# Patient Record
Sex: Female | Born: 1940 | Race: White | Hispanic: No | Marital: Married | Smoking: Never smoker
Health system: Southern US, Community
[De-identification: ages and names within clinical notes are randomized; demographics above are authoritative.]

## PROBLEM LIST (undated history)

## (undated) HISTORY — PX: NECK SURGERY: SHX720

---

## 2001-04-19 ENCOUNTER — Other Ambulatory Visit: Admission: RE | Admit: 2001-04-19 | Discharge: 2001-04-19 | Payer: Self-pay | Admitting: Obstetrics and Gynecology

## 2001-04-29 ENCOUNTER — Encounter (INDEPENDENT_AMBULATORY_CARE_PROVIDER_SITE_OTHER): Payer: Self-pay | Admitting: Specialist

## 2001-04-29 ENCOUNTER — Encounter: Payer: Self-pay | Admitting: Obstetrics and Gynecology

## 2001-04-29 ENCOUNTER — Ambulatory Visit (HOSPITAL_COMMUNITY): Admission: RE | Admit: 2001-04-29 | Discharge: 2001-04-29 | Payer: Self-pay | Admitting: Obstetrics and Gynecology

## 2004-05-16 ENCOUNTER — Encounter (INDEPENDENT_AMBULATORY_CARE_PROVIDER_SITE_OTHER): Payer: Self-pay | Admitting: *Deleted

## 2004-05-16 ENCOUNTER — Ambulatory Visit (HOSPITAL_COMMUNITY): Admission: RE | Admit: 2004-05-16 | Discharge: 2004-05-16 | Payer: Self-pay | Admitting: Obstetrics and Gynecology

## 2004-06-05 ENCOUNTER — Ambulatory Visit (HOSPITAL_COMMUNITY): Admission: RE | Admit: 2004-06-05 | Discharge: 2004-06-05 | Payer: Self-pay | Admitting: *Deleted

## 2004-08-21 ENCOUNTER — Encounter (INDEPENDENT_AMBULATORY_CARE_PROVIDER_SITE_OTHER): Payer: Self-pay | Admitting: *Deleted

## 2004-08-21 ENCOUNTER — Ambulatory Visit (HOSPITAL_COMMUNITY): Admission: RE | Admit: 2004-08-21 | Discharge: 2004-08-21 | Payer: Self-pay | Admitting: *Deleted

## 2004-12-12 ENCOUNTER — Ambulatory Visit (HOSPITAL_COMMUNITY): Admission: RE | Admit: 2004-12-12 | Discharge: 2004-12-12 | Payer: Self-pay | Admitting: *Deleted

## 2005-04-03 ENCOUNTER — Other Ambulatory Visit: Admission: RE | Admit: 2005-04-03 | Discharge: 2005-04-03 | Payer: Self-pay | Admitting: Obstetrics and Gynecology

## 2006-12-14 ENCOUNTER — Encounter (INDEPENDENT_AMBULATORY_CARE_PROVIDER_SITE_OTHER): Payer: Self-pay | Admitting: *Deleted

## 2006-12-14 ENCOUNTER — Ambulatory Visit (HOSPITAL_COMMUNITY): Admission: RE | Admit: 2006-12-14 | Discharge: 2006-12-14 | Payer: Self-pay | Admitting: *Deleted

## 2007-07-14 ENCOUNTER — Encounter (INDEPENDENT_AMBULATORY_CARE_PROVIDER_SITE_OTHER): Payer: Self-pay | Admitting: Obstetrics and Gynecology

## 2007-07-14 ENCOUNTER — Ambulatory Visit (HOSPITAL_COMMUNITY): Admission: RE | Admit: 2007-07-14 | Discharge: 2007-07-14 | Payer: Self-pay | Admitting: Obstetrics and Gynecology

## 2009-03-07 ENCOUNTER — Ambulatory Visit (HOSPITAL_COMMUNITY): Admission: RE | Admit: 2009-03-07 | Discharge: 2009-03-07 | Payer: Self-pay | Admitting: Obstetrics and Gynecology

## 2010-03-28 ENCOUNTER — Other Ambulatory Visit: Payer: Self-pay

## 2010-03-28 DIAGNOSIS — Z1231 Encounter for screening mammogram for malignant neoplasm of breast: Secondary | ICD-10-CM

## 2010-04-11 ENCOUNTER — Observation Stay (HOSPITAL_COMMUNITY)
Admission: EM | Admit: 2010-04-11 | Discharge: 2010-04-12 | Disposition: A | Discharge status: Home / Self Care | Payer: Medicare Other | Attending: Internal Medicine | Admitting: Internal Medicine

## 2010-04-11 ENCOUNTER — Emergency Department (HOSPITAL_COMMUNITY): Payer: Medicare Other

## 2010-04-11 ENCOUNTER — Ambulatory Visit (HOSPITAL_COMMUNITY)
Admission: RE | Admit: 2010-04-11 | Discharge: 2010-04-11 | Disposition: A | Discharge status: Home / Self Care | Payer: Medicare Other | Source: Ambulatory Visit | Attending: Internal Medicine | Admitting: Internal Medicine

## 2010-04-11 DIAGNOSIS — R51 Headache: Secondary | ICD-10-CM | POA: Insufficient documentation

## 2010-04-11 DIAGNOSIS — R4789 Other speech disturbances: Secondary | ICD-10-CM | POA: Insufficient documentation

## 2010-04-11 DIAGNOSIS — R4182 Altered mental status, unspecified: Principal | ICD-10-CM | POA: Insufficient documentation

## 2010-04-11 DIAGNOSIS — E876 Hypokalemia: Secondary | ICD-10-CM | POA: Insufficient documentation

## 2010-04-11 DIAGNOSIS — E785 Hyperlipidemia, unspecified: Secondary | ICD-10-CM | POA: Insufficient documentation

## 2010-04-11 DIAGNOSIS — R55 Syncope and collapse: Secondary | ICD-10-CM | POA: Insufficient documentation

## 2010-04-11 DIAGNOSIS — R5383 Other fatigue: Secondary | ICD-10-CM | POA: Insufficient documentation

## 2010-04-11 DIAGNOSIS — I10 Essential (primary) hypertension: Secondary | ICD-10-CM | POA: Insufficient documentation

## 2010-04-11 DIAGNOSIS — R5381 Other malaise: Secondary | ICD-10-CM | POA: Insufficient documentation

## 2010-04-11 DIAGNOSIS — Z7982 Long term (current) use of aspirin: Secondary | ICD-10-CM | POA: Insufficient documentation

## 2010-04-11 DIAGNOSIS — J329 Chronic sinusitis, unspecified: Secondary | ICD-10-CM | POA: Insufficient documentation

## 2010-04-11 DIAGNOSIS — Z800 Family history of malignant neoplasm of digestive organs: Secondary | ICD-10-CM | POA: Insufficient documentation

## 2010-04-11 DIAGNOSIS — Z1231 Encounter for screening mammogram for malignant neoplasm of breast: Secondary | ICD-10-CM | POA: Insufficient documentation

## 2010-04-11 DIAGNOSIS — R42 Dizziness and giddiness: Secondary | ICD-10-CM | POA: Insufficient documentation

## 2010-04-11 DIAGNOSIS — Z79899 Other long term (current) drug therapy: Secondary | ICD-10-CM | POA: Insufficient documentation

## 2010-04-11 LAB — URINALYSIS, ROUTINE W REFLEX MICROSCOPIC
Bilirubin Urine: NEGATIVE
Hgb urine dipstick: NEGATIVE
Specific Gravity, Urine: 1.018 (ref 1.005–1.030)
Urobilinogen, UA: 0.2 mg/dL (ref 0.0–1.0)
pH: 6.5 (ref 5.0–8.0)

## 2010-04-11 LAB — DIFFERENTIAL
Basophils Absolute: 0.1 10*3/uL (ref 0.0–0.1)
Basophils Relative: 1 % (ref 0–1)
Lymphocytes Relative: 23 % (ref 12–46)
Monocytes Absolute: 0.4 10*3/uL (ref 0.1–1.0)
Monocytes Relative: 4 % (ref 3–12)
Neutro Abs: 6.4 10*3/uL (ref 1.7–7.7)
Neutrophils Relative %: 72 % (ref 43–77)

## 2010-04-11 LAB — CBC
HCT: 36.3 % (ref 36.0–46.0)
Hemoglobin: 11.9 g/dL — ABNORMAL LOW (ref 12.0–15.0)
RBC: 4.44 MIL/uL (ref 3.87–5.11)

## 2010-04-11 LAB — COMPREHENSIVE METABOLIC PANEL
ALT: 13 U/L (ref 0–35)
AST: 16 U/L (ref 0–37)
Albumin: 3.7 g/dL (ref 3.5–5.2)
Calcium: 9.3 mg/dL (ref 8.4–10.5)
Creatinine, Ser: 0.85 mg/dL (ref 0.4–1.2)
GFR calc Af Amer: >60 mL/min (ref >60)
Sodium: 140 mEq/L (ref 135–145)
Total Protein: 6.4 g/dL (ref 6.0–8.3)

## 2010-04-11 LAB — POCT CARDIAC MARKERS
Myoglobin, poc: 61.5 ng/mL (ref 12–200)
Troponin i, poc: <0.05 ng/mL (ref 0.00–0.09)

## 2010-04-12 ENCOUNTER — Observation Stay (HOSPITAL_COMMUNITY): Payer: Medicare Other

## 2010-04-12 DIAGNOSIS — G459 Transient cerebral ischemic attack, unspecified: Secondary | ICD-10-CM

## 2010-04-12 LAB — GLUCOSE, CAPILLARY

## 2010-04-12 LAB — APTT
aPTT: 27 seconds (ref 24–37)
aPTT: 29 seconds (ref 24–37)

## 2010-04-12 LAB — CBC
HCT: 34.3 % — ABNORMAL LOW (ref 36.0–46.0)
Hemoglobin: 11.2 g/dL — ABNORMAL LOW (ref 12.0–15.0)
MCV: 81.1 fL (ref 78.0–100.0)
RBC: 4.23 MIL/uL (ref 3.87–5.11)
RDW: 13 % (ref 11.5–15.5)
WBC: 7.3 10*3/uL (ref 4.0–10.5)

## 2010-04-12 LAB — LIPID PANEL
Cholesterol: 190 mg/dL (ref 0–200)
LDL Cholesterol: 127 mg/dL — ABNORMAL HIGH (ref 0–99)
Total CHOL/HDL Ratio: 3.5 RATIO
Triglycerides: 46 mg/dL (ref <150)

## 2010-04-12 LAB — COMPREHENSIVE METABOLIC PANEL
Alkaline Phosphatase: 28 U/L — ABNORMAL LOW (ref 39–117)
BUN: 12 mg/dL (ref 6–23)
Creatinine, Ser: 0.79 mg/dL (ref 0.4–1.2)
Glucose, Bld: 103 mg/dL — ABNORMAL HIGH (ref 70–99)
Potassium: 3.8 mEq/L (ref 3.5–5.1)
Total Protein: 5.8 g/dL — ABNORMAL LOW (ref 6.0–8.3)

## 2010-04-12 LAB — CARDIAC PANEL(CRET KIN+CKTOT+MB+TROPI)
Relative Index: INVALID (ref 0.0–2.5)
Total CK: 78 U/L (ref 7–177)
Troponin I: 0.01 ng/mL (ref 0.00–0.06)

## 2010-04-12 LAB — PROTIME-INR: INR: 1.06 (ref 0.00–1.49)

## 2010-04-12 LAB — HEMOGLOBIN A1C: Hgb A1c MFr Bld: 6 % — ABNORMAL HIGH (ref <5.7)

## 2010-04-13 NOTE — Discharge Summary (Signed)
Leslie Orozco, Leslie Orozco             ACCOUNT NO.:  [id number]  MEDICAL RECORD NO.:  [id number]           PATIENT TYPE:  I  LOCATION:  1513                         FACILITY:  [facility]  PHYSICIAN:  Talmage Nap, MD  DATE OF BIRTH:  09-May-1940  DATE OF ADMISSION:  04/11/2010 DATE OF DISCHARGE:  04/12/2010                        DISCHARGE SUMMARY - REFERRING   DISCHARGING DOCTOR:  Talmage Nap, M.D.  PRIMARY CARE PHYSICIAN:  Soyla Murphy. Renne Crigler, M.D.  DISCHARGE DIAGNOSES: 1. Questionable transient ischemic attack. 2. Dyslipidemia. 3. Hypertension.  HOSPITAL COURSE:  Patient is a 70 year old African-American female with history of hypertension, who was admitted to the hospital on April 11, 2010 by Dr. Conley Canal with complaints of altered mental status and weakness.  At about 7:00 p.m. to 8:00 p.m. on April 11, 2010, patient was said to be having extreme difficulty in getting off the bed and her speech was said to have been slurred at that time as well.  This was, however, said to be very transient.  There was no history of frank loss of consciousness.  There was no history of chest pain.  There was no history of weakness.  There was no history of fever.  No chills.  No rigor.  No shortness of breath.  At the time patient was seen in the emergency room, she was said to be lucid and she denied any complaint. She was, however, admitted for further evaluation to rule out CVA.  MEDICATIONS:  Her preadmission meds as per the initial H and P were aspirin and blood pressure medication, name is unknown.  ALLERGIES:  She has no known allergies.  SOCIAL HISTORY:  [place] Spaniel to be negative for alcohol, tobacco use.  Lives at home with her spouse.  FAMILY HISTORY:  York Spaniel to be negative for coronary artery disease, but there is, however, a family history of colon cancer.  REVIEW OF SYSTEMS:  Essentially documented in the initial history and physical at the time patient was seen by the  admitting physician.  PHYSICAL EXAMINATION:  VITAL SIGNS:  Blood pressure is 114/82, heart rate 87, temperature is 98.4, respiratory rate 18, saturating 98% in room air. HEENT:  Pupils are reactive to light and extraocular muscles are intact. NECK:  No jugular venous distention.  No carotid bruit.  No lymphadenopathy. CHEST:  Clear to auscultation. HEART:  Heart sounds are one and two. ABDOMEN:  Said to be soft, nontender.  Liver, spleen, kidney not palpable.  Bowel sounds are positive. EXTREMITIES:  No pedal edema. NEUROLOGIC EXAMINATION:  Did not show any lateralizing signs. NEUROPSYCHIATRIC EVALUATION:  Unremarkable.  LABORATORY DATA:  Urinalysis unremarkable.  Complete blood count with differential showed WBC of 9.0, hemoglobin 11.9, hematocrit 36.3, MCV 81.8 with the platelet count of 322, normal differentials.  Cardiac markers:  Troponin-I less than 0.01.  Comprehensive metabolic panel showed sodium of 140, potassium of 3.4, chloride of 108 with the bicarb of 27, glucose is 114, BUN is 13, creatinine 0.85.  Coagulation profile showed PT 30.3, INR of 0.9 and APTT of 29.  Hemoglobin A1c is 6.0. Lipid panel showed total cholesterol of 190, triglyceride 46, HDL cholesterol 54, LDL  cholesterol is 127.  A repeat comprehensive metabolic panel done on April 12, 2010 showed sodium of 140, potassium of 3.8, chloride of 109 with the bicarb of 24, glucose is 103, BUN is 12, creatinine 0.79.  LFTs normal.  DIAGNOSTIC STUDIES:  Imaging studies done include CT of the head without contrast, normal.  MRI of the head without contrast showed mild chronic sinusitis, otherwise unremarkable.  MRA of the brain negative.  Carotid duplex did not show any stenosis of the RCA and 2-D echo showed normal left ventricular cavity with an EF of 65%-60%, no regional wall motion abnormalities seen and PA pressure of 35 mmHg.  HOSPITAL COURSE:  Patient was admitted to telemetry.  She was given aspirin 325 mg  p.o. daily and she was also given amlodipine 5 mg p.o. daily.  Since the patient was slightly hypokalemic, she was given potassium chloride 40 mEq p.o. stat.  Other medications given to the patient include pantoprazole 40 mg p.o. q.12h. for GI prophylaxis, Zocor 20 mg p.o. daily and Lovenox 40 mg subcu q.24h. for DVT prophylaxis. Patient was, however, seen by me for the very first time in this admission today and denied any chest pain, no slurred speech.  No weakness on any side of the body.  Examination of the patient was essentially unremarkable.  Her vital signs:  Blood pressure is 110/60, temperature is 98.0, pulse 61, respiratory rate 20, medically stable. Plan is for patient to be discharged home today on activity as tolerated.  Low-sodium, low-cholesterol diet.  She is to follow with her primary care physician in 1-2 weeks.  DISCHARGE MEDICATIONS:  Medications to be taken at home will include: 1. Aspirin 325 mg p.o. daily. 2. Simvastatin 20 mg one p.o. daily. 3. Benazepril/hydrochlorothiazide 10/12.5 one p.o. daily. 4. Calcium carbonate/vitamin 600/400 one p.o. daily. 5. Estradiol 1 mg p.o. daily. 6. Fish oil 1000 mg one p.o. b.i.d. 7. Medroxyprogesterone acetate 2.5 mg 2 tablets p.o. daily. 8. Vitamin D 2000 units 1 tablet p.o. daily.     Talmage Nap, MD     CN/MEDQ  D:  04/12/2010  T:  04/12/2010  Job:  [id number]  cc:   Soyla Murphy. Renne Crigler, M.D. Fax: [phone]  Electronically Signed by Talmage Nap  on 04/13/2010 06:47:04 AM

## 2010-04-13 NOTE — H&P (Signed)
Leslie Orozco, Leslie Orozco             ACCOUNT NO.:  [id number]  MEDICAL RECORD NO.:  [id number]           PATIENT TYPE:  LOCATION:                                 FACILITY:  PHYSICIAN:  Conley Canal, MD      DATE OF BIRTH:  Mar 25, 1940  DATE OF ADMISSION: DATE OF DISCHARGE:                             HISTORY & PHYSICAL   PRIMARY CARE PHYSICIAN:  Soyla Murphy. Renne Crigler, MD  CHIEF COMPLAINT:  Altered mental status, weakness.  HISTORY OF PRESENT ILLNESS:  Leslie Orozco is a pleasant 70 year old female with history of hypertension, who comes in with complaints of altered mental status that started around 8 p.m.  She states that she had been feeling dizzy and weak around 7 p.m.  She took a shower and around 8 p.m., her husband found her partially in bed.  The patient states that she could not get into bed as she was feeling weak.  She apparently had slurred speech at that time.  No reported history of seizure. Otherwise, the patient denies headaches.  No fever, no palpitations, but she mentions that she was told from age 45 that she had an irregular heartbeat and her doctor discouraged her from taking caffeine, but she has not been taking any medications other than aspirin for the irregular heartbeat.  Otherwise at the time of my evaluation, the patient is quite lucid and denies any complaints.  PAST MEDICAL HISTORY:  Hypertension.  ALLERGIES:  No known drug allergies.  HOME MEDICATIONS:  The patient cannot confirm other than aspirin and some blood pressure medications.  FAMILY HISTORY:  Negative for premature coronary artery disease, but there is a history of colon cancer in her brother, her sister died at age 49 of some type of cancer.  REVIEW OF SYSTEMS:  Unremarkable except as highlighted in the history of present illness.  SOCIAL HISTORY:  The patient is married, lives with her husband.  Denies cigarette smoking, alcohol, or illicit drugs.  PHYSICAL EXAMINATION:  GENERAL:  This  is a pleasant lady who is not in acute distress. VITAL SIGNS:  Blood pressure 114/82, heart rate is 87, temperature 98.4, respirations 18, and oxygen saturation is 98% on room air. HEAD, EARS, EYES, NOSE, AND THROAT:  Pupils equal, reacting to light. NECK:  No jugular venous distention.  No carotid bruits. RESPIRATORY:  Good air entry bilaterally with no rhonchi, rales, or wheezes. CARDIOVASCULAR:  First and second heart sounds heard.  No murmurs. Pulse regular. ABDOMEN:  Soft and nontender.  No palpable organomegaly.  Bowel sounds are normal. CNS: The patient is alert and oriented to person, place, and time.  She has normal speech.  No localizing neurological deficits. EXTREMITIES:  No pedal edema.  Peripheral pulses equal.  LABORATORY DATA:  Reviewed, significant for WBC 9, hemoglobin 11.9, hematocrit 36. 3, and platelet count 322.  Urinalysis normal.  Point-of- care cardiac enzymes negative x1.  Electrolytes show sodium 140, potassium 3.4, BUN 13, creatinine 0.85, and glucose 114.  Transaminases normal.  Coagulation profile normal.  CT of the brain without contrast was normal.  IMPRESSION:  A 70 year old female with history of hypertension which  seems to be reasonably controlled and a questionable history of irregular heartbeat from childhood, who is presenting with transient neurological symptoms of altered mental status and weakness.  The differential diagnosis is transient ischemic attack versus a small cerebrovascular accident with no residual deficit at present.  PLAN: 1. TIA versus CVA.  We will admit the patient to telemetry.  We will     perform TIA workup including 2-D echocardiogram, EKG, carotid     Dopplers, and MRI of the brain.  Monitor the patient on telemetry.     Obtain hemoglobin A1c, fasting lipids panel.  Meanwhile, the     patient will be on full-dose aspirin.  We will continue blood     pressure control.  Plan to resume home medications once confirmed. 2.  DVT and GI prophylaxis.  The patient's condition is stable.     Conley Canal, MD     SR/MEDQ  D:  04/12/2010  T:  04/12/2010  Job:  [id number]  cc:   Soyla Murphy. Renne Crigler, M.D.  Electronically Signed by Conley Canal  on 04/13/2010 05:08:18 AM

## 2010-06-18 NOTE — Op Note (Signed)
Leslie Orozco, Leslie Orozco             ACCOUNT NO.:  [id number]   MEDICAL RECORD NO.:  [id number]          PATIENT TYPE:  AMB   LOCATION:  ENDO                         FACILITY:  [facility]   PHYSICIAN:  Georgiana Spinner, M.D.    DATE OF BIRTH:  1940-03-11   DATE OF PROCEDURE:  DATE OF DISCHARGE:                               OPERATIVE REPORT   PROCEDURE:  Colonoscopy.   INDICATIONS:  Colon polyps.   ANESTHESIA:  Fentanyl 70 mcg, Versed 6 mg.   PROCEDURE:  With the patient mildly sedated in the left lateral  decubitus position, the Pentax videoscopic colonoscope was inserted in  the rectum and passed under direct vision subsequently to reach the  cecum, identified by the ileocecal valve and the appendiceal orifice,  both of which were photographed.  From this point colonoscope was slowly  withdrawn, taking circumferential views of the colonic mucosa, stopping  in the ascending colon near the hepatic flexure where a small polyp was  seen, photographed, and removed using hot biopsy forceps technique,  setting of 20/150 blended current.  We next stopped in the rectum which  appeared normal other than a scar from a previous polypectomy that had  been done 2 years ago and on retroflexed view showed hemorrhoids.  The  endoscope was straightened and withdrawn.  The patient's vital signs and  pulse oximeter remained stable.  The patient tolerated the procedure  well without apparent complications.   FINDINGS:  Internal hemorrhoids and polyp of the hepatic flexure.  Diverticulosis with some twisting of the colon is noted.   PLAN:  Await biopsy report.  The patient will call me for results and  follow up with me as an outpatient as needed.           ______________________________  Georgiana Spinner, M.D.     GMO/MEDQ  D:  12/14/2006  T:  12/14/2006  Job:  [id number]

## 2010-06-21 NOTE — Op Note (Signed)
[facility]  Patient:    Leslie Orozco, Leslie Orozco Visit Number: [id number] MRN: [id number]          Service Type: DSU Location: DAY Attending Physician:  Rosalee Kaufman Dictated by:   Harl Bowie, M.D. Proc. Date: 04/29/01 Admit Date:  04/29/2001                             Operative Report  PREOPERATIVE DIAGNOSIS:  Postmenopausal bleeding on hormone replacement therapy.  POSTOPERATIVE DIAGNOSIS:  Postmenopausal bleeding on hormone replacement therapy, with possible polyp from the endometrium.  OPERATION:  D&C.  SURGEON:  Harl Bowie, M.D.  ANESTHESIA:  MAC with local.  FINDINGS AND PROCEDURE:  The patient prepped and draped in the usual fashion for a vaginal procedure.  The patient examined and found to have a uterus irregular and enlarged.  Following, the cervix was grasped with a single-tooth tenaculum, and 1% Xylocaine was infiltrated around the cervix.  The cervix was then sounded to three inches.  The cervix was dilated and the cavity entered with a sharp curette.  It was noted that the cavity was somewhat irregular.  A small to moderate amount of tissue was obtained with a fragment that had the appearance of a polyp.  No additional tissue was obtained with the round forceps and a serrated curette.  Blood loss during the procedure was minimal. The patient tolerated the procedure well and sent to the recovery room in good condition. Dictated by:   Harl Bowie, M.D. Attending Physician:  Rosalee Kaufman DD:  04/29/01 TD:  04/29/01 Job: [id number]

## 2010-06-21 NOTE — Op Note (Signed)
NAMEROANNE, HAYE             ACCOUNT NO.:  [id number]   MEDICAL RECORD NO.:  [id number]          PATIENT TYPE:  AMB   LOCATION:  ENDO                         [facility]ACILITY:  [facility]   PHYSICIAN:  Georgiana Spinner, M.D.    DATE OF BIRTH:  1940-12-22   DATE OF PROCEDURE:  06/05/2004  DATE OF DISCHARGE:                                 OPERATIVE REPORT   PROCEDURE:  Flexible sigmoidoscopy.   INDICATIONS:  Colon polyps.   ANESTHESIA:  Demerol 60, Versed 6 mg.   DESCRIPTION OF PROCEDURE:  With the patient mildly sedated in the left  lateral decubitus position, the Olympus videoscopic colonoscope was inserted  in the rectum after rectal examination revealed a stricture. Subsequently we  had passed this under direct vision to the sigmoid colon but could advance  it no further at which point the colon made a sharp turn to tight for me to  be able to pass the colonoscope despite repeated efforts.  I decided  therefore to withdraw the scope taking circumferential views of the  remaining colonic mucosa stopping in the rectum which showed a polyp  photographed on direct view and appeared unremarkable on retroflexed view.  The endoscope was straightened and withdrawn. The patient's vital signs and  pulse oximeter remained stable. The patient tolerated procedure well without  complications.   FINDINGS:  Polyp of rectum, stricture of sigmoid colon area, stricture of  anal canal.   PLAN:  Air-contrast barium enema to review the remainder of the colon and  will have the patient follow-up with me as subsequently she will need at  least a sigmoidoscopy to remove the polyp seen in the rectum.      GMO/MEDQ  D:  06/05/2004  T:  06/05/2004  Job:  [id number]

## 2010-06-21 NOTE — Op Note (Signed)
NAMEMERICA, PRELL             ACCOUNT NO.:  [id number]   MEDICAL RECORD NO.:  [id number]          PATIENT TYPE:  AMB   LOCATION:  DAY                          FACILITY:  [facility]   PHYSICIAN:  Leona Singleton, M.D.     DATE OF BIRTH:  28-Aug-1940   DATE OF PROCEDURE:  05/16/2004  DATE OF DISCHARGE:                                 OPERATIVE REPORT   PREOPERATIVE DIAGNOSES:  Postmenopausal bleeding on hormone replacement  therapy.   POSTOPERATIVE DIAGNOSES:  Postmenopausal bleeding on hormone replacement  therapy.   OPERATION:  D&C.   SURGEON:  Leona Singleton, M.D.   ANESTHESIA:  MAC with local.   FINDINGS AND PROCEDURE:  The patient was prepped and draped in the usual  fashion for a vaginal procedure. The patient examined and found to have the  uterus normal size, the adnexa clear.  Following a weighted speculum was  placed in the vagina and the cervix grasped with a single tooth tenaculum.  The cervix was sounded to 2 3/4 inches, cervix dilated and the cavity  entered with a sharp curette. A small amount of tissue was obtained, a small  amount of additional tissue was obtained with the Randall stone forceps and  the serrated curette. Blood loss during the procedure was minimal. The  patient tolerated the procedure well and was transferred to the recovery  room in good condition.      KB/MEDQ  D:  05/16/2004  T:  05/16/2004  Job:  [id number]

## 2010-06-21 NOTE — Op Note (Signed)
Leslie Orozco, Leslie Orozco             ACCOUNT NO.:  [id number]   MEDICAL RECORD NO.:  [id number]          PATIENT TYPE:  AMB   LOCATION:  SDC                           FACILITY:  [facility]   PHYSICIAN:  Carrington Clamp, M.D. DATE OF BIRTH:  02-01-1941   DATE OF PROCEDURE:  04/13/2007  DATE OF DISCHARGE:                               OPERATIVE REPORT   PREOPERATIVE DIAGNOSIS:  Postmenopausal bleeding.   POSTOPERATIVE DIAGNOSES:  1. Postmenopausal bleeding.  2. Polyps.   PROCEDURE:  Dilation and curettage with hysteroscopy.   SURGEON:  Carrington Clamp, MD   ASSISTANT:  None.   ANESTHESIA:  General LMA.   FINDINGS:  A number of intrauterine polyps.  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  This was  diagnosed as a septum because it was solid through and through in the  midline, it was then appeared to be a fibroid that was attached on one  side, but not on the other.  The attachment was both anterior and  posterior.  Hysteroscopy deficit was 35 mL.   SPECIMENS:  Uterine curettings.   DISPOSITION:  To Pathology.   ESTIMATED BLOOD LOSS:  Minimal.   IV FLUIDS:  500 mL.   URINE OUTPUT:  Not measured.   COMPLICATIONS:  None.   COUNTS:  Correct x3.   TECHNIQUE:  After adequate LMA anesthesia was achieved, the patient was  prepped and draped in the usual sterile fashion in dorsal lithotomy  position.  The bladder was emptied with a red rubber catheter and a  speculum placed in the vagina.  The cervix was grasped with a single-  tooth tenaculum and the cervix dilated up with Shawnie Pons dilators.  The  hysteroscope was passed into the uterine cavity and the above findings  noted.  Alternating grasping  with the polyp forceps and sharp curettage was performed to remove  tissue from the uterine cavity.  Once the polyps were apparently all  removed via a check by the hysteroscope, all instruments were then  withdrawn from the vagina, and  the patient tolerated the procedure well  and was returned to recovery room in stable condition.      Carrington Clamp, M.D.  Electronically Signed     MH/MEDQ  D:  07/14/2007  T:  07/15/2007  Job:  [id number]

## 2010-06-21 NOTE — Op Note (Signed)
NAMEBRITLYN, MARTINE             ACCOUNT NO.:  [id number]   MEDICAL RECORD NO.:  [id number]          PATIENT TYPE:  AMB   LOCATION:  ENDO                         FACILITY:  [facility]   PHYSICIAN:  Georgiana Spinner, M.D.    DATE OF BIRTH:  [birth year]   DATE OF PROCEDURE:  DATE OF DISCHARGE:                                 OPERATIVE REPORT   PROCEDURE PERFORMED:  Flexible sigmoidoscopy.   INDICATIONS:  ___________   ANESTHESIA:  None given.   PROCEDURE:  With the patient ____________ rectal exam ___________ into the  rectum ___________ Olympus videoscopic colonoscope  and passed it under  direct vision to sigmoid colon identified by diverticulosis seen which was  approximately 50 cm from the anal verge.  Failure to progress the endoscope  was then slowly withdrawn ___________ colonic mucosa stopping in the rectum  which appeared normal on direct and showed small hemorrhoids on retroflex  view.  The endoscope was straightened and withdrawn.  __________ vital signs  and pulse oximeter remained stable.  The patient tolerated the procedure  well __________ apparent complications.   FINDINGS:  __________ hemorrhoids, no residual polyp.   PLAN:  Have patient follow-up ____________.           ______________________________  Georgiana Spinner, M.D.     GMO/MEDQ  D:  12/12/2004  T:  12/12/2004  Job:  [id number]

## 2010-06-21 NOTE — Op Note (Signed)
NAMEJAILEEN, Leslie Orozco             ACCOUNT NO.:  [id number]   MEDICAL RECORD NO.:  [id number]          PATIENT TYPE:  AMB   LOCATION:  ENDO                         FACILITY:  [facility]   PHYSICIAN:  Georgiana Spinner, M.D.    DATE OF BIRTH:  Sep 06, 1940   DATE OF PROCEDURE:  08/21/2004  DATE OF DISCHARGE:                                 OPERATIVE REPORT   PROCEDURE:  Flexible sigmoidoscopy with polypectomy.   INDICATIONS:  Colon polyp.   ANESTHESIA:  None given.   DESCRIPTION OF PROCEDURE:  With the patient in the left lateral decubitus  position, the Olympus videoscopic pediatric colonoscope was inserted in the  rectum and passed under direct vision to approximately 25 cm from the anal  verge. From this point, the colonoscope was slowly withdrawn taking  circumferential views of the colonic mucosa stopping in the rectum which  showed a polyp on direct view and was unremarkable on retroflexed view. The  endoscope was straightened, snare was passed and the polyp was removed and  suctioned to the scope. The endoscope was withdrawn. The patient's vital  signs and pulse oximeter remained stable. The patient tolerated the  procedure well without apparent complications.   FINDINGS:  Polyp of rectum removed, await biopsy report. The patient will  call me for results and follow-up with me as an outpatient       GMO/MEDQ  D:  08/21/2004  T:  08/21/2004  Job:  [id number]

## 2010-10-31 LAB — CBC
MCV: 81.6
RBC: 4.56
WBC: 4.2

## 2010-10-31 LAB — URINALYSIS, ROUTINE W REFLEX MICROSCOPIC
Glucose, UA: NEGATIVE
pH: 7

## 2010-10-31 LAB — BASIC METABOLIC PANEL
Chloride: 101
GFR calc Af Amer: >60
Potassium: 3.1 — ABNORMAL LOW
Sodium: 136

## 2011-03-06 ENCOUNTER — Other Ambulatory Visit (HOSPITAL_COMMUNITY): Payer: Self-pay | Admitting: Internal Medicine

## 2011-03-06 DIAGNOSIS — Z1231 Encounter for screening mammogram for malignant neoplasm of breast: Secondary | ICD-10-CM

## 2011-04-15 ENCOUNTER — Ambulatory Visit (HOSPITAL_COMMUNITY)
Admission: RE | Admit: 2011-04-15 | Discharge: 2011-04-15 | Disposition: A | Discharge status: Home / Self Care | Payer: Medicare Other | Source: Ambulatory Visit | Attending: Internal Medicine | Admitting: Internal Medicine

## 2011-04-15 DIAGNOSIS — Z1231 Encounter for screening mammogram for malignant neoplasm of breast: Secondary | ICD-10-CM

## 2011-07-29 ENCOUNTER — Other Ambulatory Visit (HOSPITAL_COMMUNITY)
Admission: RE | Admit: 2011-07-29 | Discharge: 2011-07-29 | Disposition: A | Discharge status: Home / Self Care | Payer: Medicare Other | Source: Ambulatory Visit | Attending: Internal Medicine | Admitting: Internal Medicine

## 2011-07-29 ENCOUNTER — Other Ambulatory Visit: Payer: Self-pay | Admitting: Registered Nurse

## 2011-07-29 DIAGNOSIS — Z01419 Encounter for gynecological examination (general) (routine) without abnormal findings: Secondary | ICD-10-CM | POA: Insufficient documentation

## 2012-04-01 ENCOUNTER — Other Ambulatory Visit (HOSPITAL_COMMUNITY): Payer: Self-pay | Admitting: Internal Medicine

## 2012-04-01 DIAGNOSIS — Z1231 Encounter for screening mammogram for malignant neoplasm of breast: Secondary | ICD-10-CM

## 2012-04-02 ENCOUNTER — Other Ambulatory Visit: Payer: Self-pay | Admitting: Gastroenterology

## 2012-04-15 ENCOUNTER — Ambulatory Visit (HOSPITAL_COMMUNITY)
Admission: RE | Admit: 2012-04-15 | Discharge: 2012-04-15 | Disposition: A | Discharge status: Home / Self Care | Payer: Medicare Other | Source: Ambulatory Visit | Attending: Internal Medicine | Admitting: Internal Medicine

## 2012-04-15 DIAGNOSIS — Z1231 Encounter for screening mammogram for malignant neoplasm of breast: Secondary | ICD-10-CM

## 2013-05-30 ENCOUNTER — Other Ambulatory Visit (HOSPITAL_COMMUNITY): Payer: Self-pay | Admitting: Internal Medicine

## 2013-05-30 DIAGNOSIS — Z1231 Encounter for screening mammogram for malignant neoplasm of breast: Secondary | ICD-10-CM

## 2013-06-07 ENCOUNTER — Ambulatory Visit (HOSPITAL_COMMUNITY)
Admission: RE | Admit: 2013-06-07 | Discharge: 2013-06-07 | Disposition: A | Discharge status: Home / Self Care | Payer: Medicare HMO | Source: Ambulatory Visit | Attending: Internal Medicine | Admitting: Internal Medicine

## 2013-06-07 DIAGNOSIS — Z1231 Encounter for screening mammogram for malignant neoplasm of breast: Secondary | ICD-10-CM | POA: Insufficient documentation

## 2014-05-26 ENCOUNTER — Other Ambulatory Visit (HOSPITAL_COMMUNITY): Payer: Self-pay | Admitting: Internal Medicine

## 2014-05-26 DIAGNOSIS — Z1231 Encounter for screening mammogram for malignant neoplasm of breast: Secondary | ICD-10-CM

## 2014-06-09 ENCOUNTER — Ambulatory Visit (HOSPITAL_COMMUNITY)
Admission: RE | Admit: 2014-06-09 | Discharge: 2014-06-09 | Disposition: A | Discharge status: Home / Self Care | Payer: Commercial Managed Care - HMO | Source: Ambulatory Visit | Attending: Internal Medicine | Admitting: Internal Medicine

## 2014-06-09 DIAGNOSIS — Z1231 Encounter for screening mammogram for malignant neoplasm of breast: Secondary | ICD-10-CM | POA: Diagnosis present

## 2015-08-21 ENCOUNTER — Other Ambulatory Visit: Payer: Self-pay | Admitting: Internal Medicine

## 2015-08-21 DIAGNOSIS — Z1231 Encounter for screening mammogram for malignant neoplasm of breast: Secondary | ICD-10-CM

## 2015-08-28 ENCOUNTER — Ambulatory Visit
Admission: RE | Admit: 2015-08-28 | Discharge: 2015-08-28 | Disposition: A | Discharge status: Home / Self Care | Payer: Commercial Managed Care - HMO | Source: Ambulatory Visit | Attending: Internal Medicine | Admitting: Internal Medicine

## 2015-08-28 DIAGNOSIS — Z1231 Encounter for screening mammogram for malignant neoplasm of breast: Secondary | ICD-10-CM

## 2016-05-26 DIAGNOSIS — Z0000 Encounter for general adult medical examination without abnormal findings: Secondary | ICD-10-CM | POA: Diagnosis not present

## 2016-05-26 DIAGNOSIS — E559 Vitamin D deficiency, unspecified: Secondary | ICD-10-CM | POA: Diagnosis not present

## 2016-05-26 DIAGNOSIS — Z0001 Encounter for general adult medical examination with abnormal findings: Secondary | ICD-10-CM | POA: Diagnosis not present

## 2016-05-26 DIAGNOSIS — I10 Essential (primary) hypertension: Secondary | ICD-10-CM | POA: Diagnosis not present

## 2016-05-26 DIAGNOSIS — Z13220 Encounter for screening for lipoid disorders: Secondary | ICD-10-CM | POA: Diagnosis not present

## 2016-05-29 DIAGNOSIS — E7800 Pure hypercholesterolemia, unspecified: Secondary | ICD-10-CM | POA: Diagnosis not present

## 2016-05-29 DIAGNOSIS — I10 Essential (primary) hypertension: Secondary | ICD-10-CM | POA: Diagnosis not present

## 2016-05-29 DIAGNOSIS — Z0000 Encounter for general adult medical examination without abnormal findings: Secondary | ICD-10-CM | POA: Diagnosis not present

## 2016-06-03 DIAGNOSIS — H40033 Anatomical narrow angle, bilateral: Secondary | ICD-10-CM | POA: Diagnosis not present

## 2016-06-03 DIAGNOSIS — H2513 Age-related nuclear cataract, bilateral: Secondary | ICD-10-CM | POA: Diagnosis not present

## 2016-06-29 ENCOUNTER — Emergency Department (HOSPITAL_COMMUNITY): Payer: Medicare Other

## 2016-06-29 ENCOUNTER — Emergency Department (HOSPITAL_COMMUNITY)
Admission: EM | Admit: 2016-06-29 | Discharge: 2016-06-30 | Disposition: A | Discharge status: Home / Self Care | Payer: Medicare Other | Attending: Emergency Medicine | Admitting: Emergency Medicine

## 2016-06-29 DIAGNOSIS — Z7982 Long term (current) use of aspirin: Secondary | ICD-10-CM | POA: Insufficient documentation

## 2016-06-29 DIAGNOSIS — M62838 Other muscle spasm: Secondary | ICD-10-CM | POA: Diagnosis not present

## 2016-06-29 DIAGNOSIS — M546 Pain in thoracic spine: Secondary | ICD-10-CM | POA: Diagnosis present

## 2016-06-29 DIAGNOSIS — E876 Hypokalemia: Secondary | ICD-10-CM | POA: Diagnosis not present

## 2016-06-29 DIAGNOSIS — Z79899 Other long term (current) drug therapy: Secondary | ICD-10-CM | POA: Insufficient documentation

## 2016-06-29 DIAGNOSIS — R1011 Right upper quadrant pain: Secondary | ICD-10-CM | POA: Diagnosis not present

## 2016-06-29 DIAGNOSIS — R252 Cramp and spasm: Secondary | ICD-10-CM

## 2016-06-29 DIAGNOSIS — R079 Chest pain, unspecified: Secondary | ICD-10-CM | POA: Diagnosis not present

## 2016-06-29 DIAGNOSIS — R109 Unspecified abdominal pain: Secondary | ICD-10-CM | POA: Diagnosis not present

## 2016-06-29 LAB — CBC WITH DIFFERENTIAL/PLATELET
BASOS ABS: 0 10*3/uL (ref 0.0–0.1)
BASOS PCT: 0 %
EOS ABS: 0 10*3/uL (ref 0.0–0.7)
Eosinophils Relative: 0 %
HCT: 35.3 % — ABNORMAL LOW (ref 36.0–46.0)
Hemoglobin: 11.8 g/dL — ABNORMAL LOW (ref 12.0–15.0)
LYMPHS PCT: 23 %
Lymphs Abs: 1.7 10*3/uL (ref 0.7–4.0)
MCH: 26.9 pg (ref 26.0–34.0)
MCHC: 33.4 g/dL (ref 30.0–36.0)
MCV: 80.4 fL (ref 78.0–100.0)
MONO ABS: 0.5 10*3/uL (ref 0.1–1.0)
Monocytes Relative: 7 %
Neutro Abs: 5.2 10*3/uL (ref 1.7–7.7)
Neutrophils Relative %: 70 %
PLATELETS: 303 10*3/uL (ref 150–400)
RBC: 4.39 MIL/uL (ref 3.87–5.11)
RDW: 12.7 % (ref 11.5–15.5)
WBC: 7.5 10*3/uL (ref 4.0–10.5)

## 2016-06-29 LAB — URINALYSIS, ROUTINE W REFLEX MICROSCOPIC
Bilirubin Urine: NEGATIVE
Glucose, UA: NEGATIVE mg/dL
HGB URINE DIPSTICK: NEGATIVE
KETONES UR: 20 mg/dL — AB
Nitrite: NEGATIVE
PROTEIN: NEGATIVE mg/dL
Specific Gravity, Urine: 1.024 (ref 1.005–1.030)
pH: 6 (ref 5.0–8.0)

## 2016-06-29 LAB — COMPREHENSIVE METABOLIC PANEL
ALBUMIN: 3.8 g/dL (ref 3.5–5.0)
ALT: 33 U/L (ref 14–54)
AST: 26 U/L (ref 15–41)
Alkaline Phosphatase: 39 U/L (ref 38–126)
Anion gap: 9 (ref 5–15)
BUN: 12 mg/dL (ref 6–20)
CHLORIDE: 101 mmol/L (ref 101–111)
CO2: 24 mmol/L (ref 22–32)
CREATININE: 0.73 mg/dL (ref 0.44–1.00)
Calcium: 8 mg/dL — ABNORMAL LOW (ref 8.9–10.3)
GFR calc Af Amer: >60 mL/min (ref >60)
GLUCOSE: 115 mg/dL — AB (ref 65–99)
POTASSIUM: 2.6 mmol/L — AB (ref 3.5–5.1)
SODIUM: 134 mmol/L — AB (ref 135–145)
Total Bilirubin: 0.6 mg/dL (ref 0.3–1.2)
Total Protein: 6.4 g/dL — ABNORMAL LOW (ref 6.5–8.1)

## 2016-06-29 LAB — I-STAT TROPONIN, ED: Troponin i, poc: 0 ng/mL (ref 0.00–0.08)

## 2016-06-29 LAB — LIPASE, BLOOD: LIPASE: 50 U/L (ref 11–51)

## 2016-06-29 LAB — MAGNESIUM: MAGNESIUM: 2.1 mg/dL (ref 1.7–2.4)

## 2016-06-29 MED ORDER — ACETAMINOPHEN 500 MG PO TABS
1000.0000 mg | ORAL_TABLET | Freq: Once | ORAL | Status: AC
  Administered 2016-06-29: 1000 mg via ORAL
  Filled 2016-06-29: qty 2

## 2016-06-29 MED ORDER — POTASSIUM CHLORIDE 10 MEQ/100ML IV SOLN
10.0000 meq | Freq: Once | INTRAVENOUS | Status: AC
  Administered 2016-06-29: 10 meq via INTRAVENOUS
  Filled 2016-06-29: qty 100

## 2016-06-29 MED ORDER — METHOCARBAMOL 500 MG PO TABS
500.0000 mg | ORAL_TABLET | Freq: Once | ORAL | Status: AC
  Administered 2016-06-29: 500 mg via ORAL
  Filled 2016-06-29: qty 1

## 2016-06-29 MED ORDER — POTASSIUM CHLORIDE CRYS ER 20 MEQ PO TBCR
40.0000 meq | EXTENDED_RELEASE_TABLET | Freq: Once | ORAL | Status: AC
  Administered 2016-06-29: 40 meq via ORAL
  Filled 2016-06-29: qty 2

## 2016-06-29 NOTE — ED Provider Notes (Signed)
[facility]-EMERGENCY DEPT Provider Note   CSN: [id number] Arrival date & time: 06/29/16  2020     History   Chief Complaint Chief Complaint  Patient presents with  . Flank Pain    HPI Leslie Orozco is a 76 y.o. female.  The history is provided by the patient.  Back Pain   This is a new problem. The current episode started 2 days ago. The problem occurs constantly. The problem has not changed since onset.The pain is associated with no known injury. The pain is present in the thoracic spine. The quality of the pain is described as aching. The pain does not radiate. The pain is moderate. Exacerbated by: nothing. Associated symptoms include abdominal pain (ruq pain). Pertinent negatives include no chest pain, no fever and no dysuria. Treatments tried: pain med by EMS - mild relief. pain does improve with movement. The treatment provided mild relief.  Abdominal Pain   This is a new problem. The current episode started 3 to 5 hours ago. Episode frequency: intermittent. The problem has been resolved. The pain is associated with an unknown factor. The pain is located in the RUQ. The quality of the pain is cramping. The pain is moderate. Pertinent negatives include fever, diarrhea, melena, nausea, vomiting, constipation and dysuria. Nothing aggravates the symptoms. Relieved by: pain med by EMS.    No past medical history on file.  There are no active problems to display for this patient.   No past surgical history on file.  OB History    No data available       Home Medications    Prior to Admission medications   Medication Sig Start Date End Date Taking? Authorizing Provider  aspirin 325 MG EC tablet Take 325 mg by mouth daily.   Yes [provider]  atorvastatin (LIPITOR) 40 MG tablet Take 40 mg by mouth every evening. 05/21/16  Yes [provider]  Calcium Carb-Cholecalciferol (CALCIUM-VITAMIN D) 500-200 MG-UNIT tablet Take 1 tablet by mouth daily.   Yes [provider]  cholecalciferol (VITAMIN D) 1000 units tablet Take 1,000 Units by mouth daily.   Yes [provider]  losartan-hydrochlorothiazide (HYZAAR) 50-12.5 MG tablet Take 1 tablet by mouth every evening. 05/21/16  Yes [provider]  Multiple Vitamins-Minerals (MULTIVITAMIN ADULT) TABS Take 1 tablet by mouth daily.   Yes [provider]  methocarbamol (ROBAXIN) 500 MG tablet Take 1 tablet (500 mg total) by mouth at bedtime as needed for muscle spasms. 06/30/16   Cardama, Amadeo Garnet, MD  potassium chloride SA (K-DUR,KLOR-CON) 20 MEQ tablet Take 1 tablet (20 mEq total) by mouth 2 (two) times daily. 06/30/16 07/14/16  Nira Conn, MD    Family History No family history on file.  Social History Social History  Substance Use Topics  . Smoking status: Not on file  . Smokeless tobacco: Not on file  . Alcohol use Not on file     Allergies   Patient has no known allergies.   Review of Systems Review of Systems  Constitutional: Negative for fever.  Respiratory: Negative for cough, chest tightness and shortness of breath.   Cardiovascular: Negative for chest pain and leg swelling.  Gastrointestinal: Positive for abdominal pain (ruq pain). Negative for constipation, diarrhea, melena, nausea and vomiting.  Genitourinary: Negative for dysuria.  Musculoskeletal: Positive for back pain.  All other systems are reviewed and are negative for acute change except as noted in the HPI    Physical Exam Updated Vital Signs  BP (!) 174/87 (BP Location: Left Arm)   Pulse 63   Temp 98.1 F (36.7 C) (Oral)   Resp 18   SpO2 100%   Physical Exam  Constitutional: She is oriented to person, place, and time. She appears well-developed and well-nourished. No distress.  HENT:  Head: Normocephalic and atraumatic.  Nose: Nose normal.  Eyes: Conjunctivae and EOM are normal. Pupils are equal, round, and reactive to light. Right eye exhibits no discharge. Left  eye exhibits no discharge. No scleral icterus.  Neck: Normal range of motion. Neck supple.  Cardiovascular: Normal rate and regular rhythm.  Exam reveals no gallop and no friction rub.   No murmur heard. Pulmonary/Chest: Effort normal and breath sounds normal. No stridor. No respiratory distress. She has no rales.     She exhibits tenderness.  Abdominal: Soft. She exhibits no distension. There is no tenderness. There is no rigidity, no rebound, no guarding and no CVA tenderness.  Musculoskeletal: She exhibits no edema or tenderness.  Neurological: She is alert and oriented to person, place, and time.  Skin: Skin is warm and dry. No rash noted. She is not diaphoretic. No erythema.  Psychiatric: She has a normal mood and affect.  Vitals reviewed.    ED Treatments / Results  Labs (all labs ordered are listed, but only abnormal results are displayed) Labs Reviewed  URINALYSIS, ROUTINE W REFLEX MICROSCOPIC - Abnormal; Notable for the following:       Result Value   Ketones, ur 20 (*)    Leukocytes, UA TRACE (*)    Bacteria, UA FEW (*)    Squamous Epithelial / LPF 0-5 (*)    All other components within normal limits  CBC WITH DIFFERENTIAL/PLATELET - Abnormal; Notable for the following:    Hemoglobin 11.8 (*)    HCT 35.3 (*)    All other components within normal limits  COMPREHENSIVE METABOLIC PANEL - Abnormal; Notable for the following:    Sodium 134 (*)    Potassium 2.6 (*)    Glucose, Bld 115 (*)    Calcium 8.0 (*)    Total Protein 6.4 (*)    All other components within normal limits  LIPASE, BLOOD  MAGNESIUM  I-STAT TROPOININ, ED    EKG  EKG Interpretation  Date/Time:  Sunday Jun 29 2016 22:15:25 EDT Ventricular Rate:  61 PR Interval:    QRS Duration: 80 QT Interval:  561 QTC Calculation: 566 R Axis:   46 Text Interpretation:  Sinus rhythm Borderline T wave abnormalities Prolonged QT interval U waves present affecting QT/QTc calculation Confirmed by [facility] MD,  PEDRO ([id number]) on 06/29/2016 10:58:41 PM       Radiology Dg Chest 2 View  Result Date: 06/29/2016 CLINICAL DATA:  RIGHT posterior chest wall pain, RIGHT upper quadrant pain EXAM: CHEST  2 VIEW COMPARISON:  None FINDINGS: Normal heart size, mediastinal contours, and pulmonary vascularity. Lungs clear. No pleural effusion or pneumothorax. Bones unremarkable. IMPRESSION: Normal exam. Electronically Signed   By: Ulyses Southward M.D.   On: 06/29/2016 22:03    Procedures Procedures (including critical care time)  Medications Ordered in ED Medications  acetaminophen (TYLENOL) tablet 1,000 mg (1,000 mg Oral Given 06/29/16 2216)  methocarbamol (ROBAXIN) tablet 500 mg (500 mg Oral Given 06/29/16 2216)  potassium chloride SA (K-DUR,KLOR-CON) CR tablet 40 mEq (40 mEq Oral Given 06/29/16 2341)  potassium chloride 10 mEq in 100 mL IVPB (10 mEq Intravenous New Bag/Given 06/29/16 2341)     Initial Impression / Assessment and  Plan / ED Course  I have reviewed the triage vital signs and the nursing notes.  Pertinent labs & imaging results that were available during my care of the patient were reviewed by me and considered in my medical decision making (see chart for details).     Back pain is most consistent with MSK pain. Workup was significant for hypokalemia at 2.6. Repleted by mouth and IV. This is likely the cause of the patient's muscle cramps and spasms. EKG with U waves however no other significant dysrhythmias. Will provide patient with prescription for potassium. She expressed that she would be able to follow-up closely with her primary care provider this week.  Currently patient is asymptomatic with regards to her right upper quadrant pain. I feel that this may be associated with her MSK pain secondary to the hypokalemia. Labs without evidence of biliary obstruction or pancreatitis. Low suspicion for serious intra-abdominal inflammatory/infectious process.  Given her age we obtained a troponin to  rule out an atypical ACS. Her EKG did not reveal any evidence of acute ischemia. Troponin negative. Since her pain is been constant for several days feel this is sufficient. Low pretest probability for pulmonary embolism. Presentation is not classic for aortic dissection or esophageal perforation. Chest x-ray without evidence suggestive of pneumonia, pneumothorax, pneumomediastinum.  No abnormal contour of the mediastinum to suggest dissection. No evidence of acute injuries.  The patient is safe for discharge with strict return precautions.   Final Clinical Impressions(s) / ED Diagnoses   Final diagnoses:  RUQ pain  Hypokalemia  Muscle cramps   Disposition: Discharge  Condition: Good  I have discussed the results, Dx and Tx plan with the patient who expressed understanding and agree(s) with the plan. Discharge instructions discussed at great length. The patient was given strict return precautions who verbalized understanding of the instructions. No further questions at time of discharge.    New Prescriptions   METHOCARBAMOL (ROBAXIN) 500 MG TABLET    Take 1 tablet (500 mg total) by mouth at bedtime as needed for muscle spasms.   POTASSIUM CHLORIDE SA (K-DUR,KLOR-CON) 20 MEQ TABLET    Take 1 tablet (20 mEq total) by mouth 2 (two) times daily.    Follow Up: Merri BrunettePharr, Walter, MD [address] [place] [id number] [phone]   in 3-5 days, For close follow up      Nira Connardama, Pedro Eduardo, MD 06/30/16 [phone]

## 2016-06-29 NOTE — ED Notes (Signed)
Bed: WA07 Expected date:  Expected time:  Means of arrival:  Comments: 75 f right flank pain

## 2016-06-29 NOTE — ED Notes (Signed)
Patient transported to X-ray 

## 2016-06-29 NOTE — ED Triage Notes (Signed)
Per EMS:  Pt coming from home. Pt has had some right flank pain for the last few days. It was tolerable up until now. She stood up today and had excruciating pain. Pt has not had any trouble urinating or noted any blood in urine  100 fentanyl with EMS  20 LAC  No complaints of nausea or diarrhea. No fever  Last Vitals 184/98  BP 74 HR 99 % on RA CBG 114

## 2016-06-30 MED ORDER — POTASSIUM CHLORIDE CRYS ER 20 MEQ PO TBCR: 20.0000 meq | EXTENDED_RELEASE_TABLET | Freq: Two times a day (BID) | ORAL | 0 refills | Status: AC

## 2016-06-30 MED ORDER — METHOCARBAMOL 500 MG PO TABS: 500.0000 mg | ORAL_TABLET | Freq: Every evening | ORAL | 0 refills | Status: AC | PRN

## 2016-06-30 NOTE — ED Notes (Signed)
Patient is alert and oriented x3.  She was given DC instructions and follow up visit instructions.  Patient gave verbal understanding. She was DC ambulatory under her own power to home.  V/S stable.  He was not showing any signs of distress on DC 

## 2016-07-03 DIAGNOSIS — D649 Anemia, unspecified: Secondary | ICD-10-CM | POA: Diagnosis not present

## 2016-07-03 DIAGNOSIS — R9431 Abnormal electrocardiogram [ECG] [EKG]: Secondary | ICD-10-CM | POA: Diagnosis not present

## 2016-07-03 DIAGNOSIS — M62838 Other muscle spasm: Secondary | ICD-10-CM | POA: Diagnosis not present

## 2016-07-03 DIAGNOSIS — E876 Hypokalemia: Secondary | ICD-10-CM | POA: Diagnosis not present

## 2016-07-10 DIAGNOSIS — E876 Hypokalemia: Secondary | ICD-10-CM | POA: Diagnosis not present

## 2016-07-17 DIAGNOSIS — E876 Hypokalemia: Secondary | ICD-10-CM | POA: Diagnosis not present

## 2016-07-21 DIAGNOSIS — I10 Essential (primary) hypertension: Secondary | ICD-10-CM | POA: Diagnosis not present

## 2016-07-30 DIAGNOSIS — I10 Essential (primary) hypertension: Secondary | ICD-10-CM | POA: Diagnosis not present

## 2016-07-30 DIAGNOSIS — M545 Low back pain: Secondary | ICD-10-CM | POA: Diagnosis not present

## 2016-08-11 ENCOUNTER — Encounter (HOSPITAL_COMMUNITY): Payer: Self-pay | Admitting: Emergency Medicine

## 2016-08-11 ENCOUNTER — Emergency Department (HOSPITAL_COMMUNITY)
Admission: EM | Admit: 2016-08-11 | Discharge: 2016-08-11 | Disposition: A | Discharge status: Home / Self Care | Payer: Medicare Other | Source: Home / Self Care

## 2016-08-11 DIAGNOSIS — M545 Low back pain: Secondary | ICD-10-CM | POA: Insufficient documentation

## 2016-08-11 DIAGNOSIS — R1084 Generalized abdominal pain: Secondary | ICD-10-CM | POA: Diagnosis not present

## 2016-08-11 DIAGNOSIS — M549 Dorsalgia, unspecified: Secondary | ICD-10-CM | POA: Diagnosis present

## 2016-08-11 DIAGNOSIS — M62830 Muscle spasm of back: Secondary | ICD-10-CM | POA: Insufficient documentation

## 2016-08-11 DIAGNOSIS — Z5321 Procedure and treatment not carried out due to patient leaving prior to being seen by health care provider: Secondary | ICD-10-CM | POA: Insufficient documentation

## 2016-08-11 DIAGNOSIS — Z79899 Other long term (current) drug therapy: Secondary | ICD-10-CM | POA: Diagnosis not present

## 2016-08-11 DIAGNOSIS — Z7982 Long term (current) use of aspirin: Secondary | ICD-10-CM | POA: Diagnosis not present

## 2016-08-11 NOTE — ED Notes (Signed)
Pt told registration clerk she was leaving and would call ems

## 2016-08-11 NOTE — ED Triage Notes (Signed)
Pt from home with c/o left sided lower black pain that she describes as 10/10 spasms. Pt states pain began today. Pt denies urinary symptoms. Pt denies injury or trauma, Pt is not febrile nor tachycardic

## 2016-08-12 ENCOUNTER — Emergency Department (HOSPITAL_COMMUNITY)
Admission: EM | Admit: 2016-08-12 | Discharge: 2016-08-12 | Disposition: A | Discharge status: Home / Self Care | Payer: Medicare Other | Attending: Emergency Medicine | Admitting: Emergency Medicine

## 2016-08-12 DIAGNOSIS — M62830 Muscle spasm of back: Secondary | ICD-10-CM

## 2016-08-12 LAB — URINALYSIS, ROUTINE W REFLEX MICROSCOPIC
Bilirubin Urine: NEGATIVE
GLUCOSE, UA: NEGATIVE mg/dL
Hgb urine dipstick: NEGATIVE
Ketones, ur: NEGATIVE mg/dL
LEUKOCYTES UA: NEGATIVE
Nitrite: NEGATIVE
PROTEIN: NEGATIVE mg/dL
Specific Gravity, Urine: 1.009 (ref 1.005–1.030)
pH: 8 (ref 5.0–8.0)

## 2016-08-12 LAB — I-STAT CHEM 8, ED
BUN: 9 mg/dL (ref 6–20)
CALCIUM ION: 1.15 mmol/L (ref 1.15–1.40)
CHLORIDE: 104 mmol/L (ref 101–111)
CREATININE: 0.6 mg/dL (ref 0.44–1.00)
GLUCOSE: 111 mg/dL — AB (ref 65–99)
HCT: 38 % (ref 36.0–46.0)
Hemoglobin: 12.9 g/dL (ref 12.0–15.0)
Potassium: 3.8 mmol/L (ref 3.5–5.1)
Sodium: 139 mmol/L (ref 135–145)
TCO2: 26 mmol/L (ref 0–100)

## 2016-08-12 MED ORDER — OXYCODONE-ACETAMINOPHEN 5-325 MG PO TABS: 1.0000 | ORAL_TABLET | ORAL | 0 refills | Status: AC | PRN

## 2016-08-12 MED ORDER — CYCLOBENZAPRINE HCL 10 MG PO TABS: 10.0000 mg | ORAL_TABLET | Freq: Two times a day (BID) | ORAL | 0 refills | Status: AC | PRN

## 2016-08-12 MED ORDER — CYCLOBENZAPRINE HCL 10 MG PO TABS
10.0000 mg | ORAL_TABLET | Freq: Once | ORAL | Status: AC
  Administered 2016-08-12: 10 mg via ORAL
  Filled 2016-08-12: qty 1

## 2016-08-12 MED ORDER — OXYCODONE-ACETAMINOPHEN 5-325 MG PO TABS
1.0000 | ORAL_TABLET | ORAL | Status: DC | PRN
Start: 2016-08-12 — End: 2016-08-12
  Administered 2016-08-12: 1 via ORAL
  Filled 2016-08-12: qty 1

## 2016-08-12 NOTE — ED Provider Notes (Signed)
[facility]-EMERGENCY DEPT Provider Note   CSN: [id number] Arrival date & time: 08/11/16  2246     History   Chief Complaint Chief Complaint  Patient presents with  . Back Pain    HPI Leslie Orozco is a 76 y.o. female.  Patient presents with pain in the right side back since yesterday. She describes fairly sudden onset of back pain that is constant, worse with movement and is sharp, and grabbing. No SOB, cough, fever or worse pain with breathing. No chest or abdominal pain. She had the same symptoms previously and was diagnosed as hypokalemia. No nausea, vomiting. She has taken ibuprofen without relief.    The history is provided by the patient. No language interpreter was used.  Back Pain   Pertinent negatives include no chest pain, no fever, no abdominal pain, no dysuria and no weakness.    History reviewed. No pertinent past medical history.  There are no active problems to display for this patient.   Past Surgical History:  Procedure Laterality Date  . NECK SURGERY      OB History    No data available       Home Medications    Prior to Admission medications   Medication Sig Start Date End Date Taking? Authorizing Provider  aspirin 325 MG EC tablet Take 325 mg by mouth daily.    [provider]  atorvastatin (LIPITOR) 40 MG tablet Take 40 mg by mouth every evening. 05/21/16   [provider]  Calcium Carb-Cholecalciferol (CALCIUM-VITAMIN D) 500-200 MG-UNIT tablet Take 1 tablet by mouth daily.    [provider]  cholecalciferol (VITAMIN D) 1000 units tablet Take 1,000 Units by mouth daily.    [provider]  losartan-hydrochlorothiazide (HYZAAR) 50-12.5 MG tablet Take 1 tablet by mouth every evening. 05/21/16   [provider]  methocarbamol (ROBAXIN) 500 MG tablet Take 1 tablet (500 mg total) by mouth at bedtime as needed for muscle spasms. 06/30/16   Nira Conn, MD  Multiple Vitamins-Minerals (MULTIVITAMIN  ADULT) TABS Take 1 tablet by mouth daily.    [provider]  potassium chloride SA (K-DUR,KLOR-CON) 20 MEQ tablet Take 1 tablet (20 mEq total) by mouth 2 (two) times daily. 06/30/16 07/14/16  Nira Conn, MD    Family History No family history on file.  Social History Social History  Substance Use Topics  . Smoking status: Never Smoker  . Smokeless tobacco: Never Used  . Alcohol use No     Allergies   Patient has no known allergies.   Review of Systems Review of Systems  Constitutional: Negative for chills and fever.  Respiratory: Negative.  Negative for cough and shortness of breath.   Cardiovascular: Negative.  Negative for chest pain.  Gastrointestinal: Negative.  Negative for abdominal pain, nausea and vomiting.  Genitourinary: Negative.  Negative for dysuria.  Musculoskeletal: Positive for back pain.  Skin: Negative.  Negative for color change and wound.  Neurological: Negative.  Negative for weakness.     Physical Exam Updated Vital Signs BP (!) 161/86 (BP Location: Right Arm)   Pulse 89   Temp 98.6 F (37 C) (Oral)   Resp 18   SpO2 97%   Physical Exam  Constitutional: She is oriented to person, place, and time. She appears well-developed and well-nourished.  HENT:  Head: Normocephalic.  Neck: Normal range of motion. Neck supple.  Cardiovascular: Normal rate and regular rhythm.   No murmur heard. Pulmonary/Chest: Effort normal and breath sounds normal.  She has no wheezes. She has no rales. She exhibits no tenderness.  Abdominal: Soft. Bowel sounds are normal. There is no tenderness. There is no rebound and no guarding.  Musculoskeletal: Normal range of motion.       Arms: Neurological: She is alert and oriented to person, place, and time.  Skin: Skin is warm and dry. No rash noted.  Psychiatric: She has a normal mood and affect.     ED Treatments / Results  Labs (all labs ordered are listed, but only abnormal results are  displayed) Labs Reviewed  URINALYSIS, ROUTINE W REFLEX MICROSCOPIC - Abnormal; Notable for the following:       Result Value   Color, Urine STRAW (*)    APPearance HAZY (*)    All other components within normal limits  I-STAT CHEM 8, ED   Results for orders placed or performed during the hospital encounter of 08/12/16  Urinalysis, Routine w reflex microscopic  Result Value Ref Range   Color, Urine STRAW (A) YELLOW   APPearance HAZY (A) CLEAR   Specific Gravity, Urine 1.009 1.005 - 1.030   pH 8.0 5.0 - 8.0   Glucose, UA NEGATIVE NEGATIVE mg/dL   Hgb urine dipstick NEGATIVE NEGATIVE   Bilirubin Urine NEGATIVE NEGATIVE   Ketones, ur NEGATIVE NEGATIVE mg/dL   Protein, ur NEGATIVE NEGATIVE mg/dL   Nitrite NEGATIVE NEGATIVE   Leukocytes, UA NEGATIVE NEGATIVE  I-stat Chem 8, ED  Result Value Ref Range   Sodium 139 135 - 145 mmol/L   Potassium 3.8 3.5 - 5.1 mmol/L   Chloride 104 101 - 111 mmol/L   BUN 9 6 - 20 mg/dL   Creatinine, Ser 1.610.60 0.44 - 1.00 mg/dL   Glucose, Bld 096111 (H) 65 - 99 mg/dL   Calcium, Ion 0.451.15 4.091.15 - 1.40 mmol/L   TCO2 26 0 - 100 mmol/L   Hemoglobin 12.9 12.0 - 15.0 g/dL   HCT 81.138.0 91.436.0 - 78.246.0 %     EKG  EKG Interpretation None       Radiology No results found.  Procedures Procedures (including critical care time)  Medications Ordered in ED Medications  oxyCODONE-acetaminophen (PERCOCET/ROXICET) 5-325 MG per tablet 1 tablet (1 tablet Oral Given 08/12/16 0426)  cyclobenzaprine (FLEXERIL) tablet 10 mg (10 mg Oral Given 08/12/16 [id number])     Initial Impression / Assessment and Plan / ED Course  I have reviewed the triage vital signs and the nursing notes.  Pertinent labs & imaging results that were available during my care of the patient were reviewed by me and considered in my medical decision making (see chart for details).     Patient here with recurrent back pain since yesterday. Last episode was diagnosed as hypokalemia in May, which was  treated and symptoms improved. No other muscular soreness or pain. No SOB or pleuritic CP.  Patient's pain is addressed. Doubt PE, ACS or kidney stone based on presenting symptoms and exam. Will check potassium level and recheck as to pain control.    Potassium is unremarkable. Pain is resolved with medications. She is well appearing with normal VS. She can be discharged home and will follow up with PCP.   Final Clinical Impressions(s) / ED Diagnoses   Final diagnoses:  None   1. Muscular back pain  New Prescriptions New Prescriptions   No medications on file     Elpidio AnisUpstill, Langston [place], Cordelia Poche-C 08/12/16 [id number]    Paula LibraMolpus, John, MD 08/12/16 [phone]

## 2016-08-19 DIAGNOSIS — I10 Essential (primary) hypertension: Secondary | ICD-10-CM | POA: Diagnosis not present

## 2016-08-25 ENCOUNTER — Other Ambulatory Visit: Payer: Self-pay | Admitting: Internal Medicine

## 2016-08-25 DIAGNOSIS — Z1231 Encounter for screening mammogram for malignant neoplasm of breast: Secondary | ICD-10-CM

## 2016-09-01 ENCOUNTER — Ambulatory Visit
Admission: RE | Admit: 2016-09-01 | Discharge: 2016-09-01 | Disposition: A | Discharge status: Home / Self Care | Payer: Medicare Other | Source: Ambulatory Visit | Attending: Internal Medicine | Admitting: Internal Medicine

## 2016-09-01 DIAGNOSIS — Z1231 Encounter for screening mammogram for malignant neoplasm of breast: Secondary | ICD-10-CM

## 2016-10-03 ENCOUNTER — Other Ambulatory Visit: Payer: Self-pay

## 2016-10-03 NOTE — Patient Outreach (Signed)
[facility] Care Management  10/03/2016  Leslie JohnsMargaret E Orozco 03/14/1940 [id number]   Medication Adherence call to [name] the reason for this call is because Leslie Orozco is showing past due under [facility]on her Atorvastatin 40 mg spoke to patient she said doctor Renne Criglerharr  took her off this medication and put her on a different medication .   Lillia AbedAna Ollison-Moran CPhT Pharmacy Technician [facility] Care Management Direct Dial [phone]  Fax [phone] [email or web address]

## 2016-11-24 DIAGNOSIS — E7800 Pure hypercholesterolemia, unspecified: Secondary | ICD-10-CM | POA: Diagnosis not present

## 2016-12-01 DIAGNOSIS — I10 Essential (primary) hypertension: Secondary | ICD-10-CM | POA: Diagnosis not present

## 2016-12-01 DIAGNOSIS — Z23 Encounter for immunization: Secondary | ICD-10-CM | POA: Diagnosis not present

## 2016-12-01 DIAGNOSIS — E7800 Pure hypercholesterolemia, unspecified: Secondary | ICD-10-CM | POA: Diagnosis not present

## 2016-12-01 DIAGNOSIS — M545 Low back pain: Secondary | ICD-10-CM | POA: Diagnosis not present

## 2017-05-28 DIAGNOSIS — E559 Vitamin D deficiency, unspecified: Secondary | ICD-10-CM | POA: Diagnosis not present

## 2017-05-28 DIAGNOSIS — E7800 Pure hypercholesterolemia, unspecified: Secondary | ICD-10-CM | POA: Diagnosis not present

## 2017-05-28 DIAGNOSIS — I10 Essential (primary) hypertension: Secondary | ICD-10-CM | POA: Diagnosis not present

## 2017-06-04 DIAGNOSIS — I10 Essential (primary) hypertension: Secondary | ICD-10-CM | POA: Diagnosis not present

## 2017-06-04 DIAGNOSIS — Z0000 Encounter for general adult medical examination without abnormal findings: Secondary | ICD-10-CM | POA: Diagnosis not present

## 2017-08-18 ENCOUNTER — Other Ambulatory Visit: Payer: Self-pay | Admitting: Internal Medicine

## 2017-08-18 DIAGNOSIS — Z1231 Encounter for screening mammogram for malignant neoplasm of breast: Secondary | ICD-10-CM

## 2017-09-10 ENCOUNTER — Ambulatory Visit
Admission: RE | Admit: 2017-09-10 | Discharge: 2017-09-10 | Disposition: A | Discharge status: Home / Self Care | Payer: Medicare Other | Source: Ambulatory Visit | Attending: Internal Medicine | Admitting: Internal Medicine

## 2017-09-10 DIAGNOSIS — Z1231 Encounter for screening mammogram for malignant neoplasm of breast: Secondary | ICD-10-CM

## 2017-10-30 DIAGNOSIS — Z23 Encounter for immunization: Secondary | ICD-10-CM | POA: Diagnosis not present

## 2017-11-30 DIAGNOSIS — E7800 Pure hypercholesterolemia, unspecified: Secondary | ICD-10-CM | POA: Diagnosis not present

## 2017-12-07 DIAGNOSIS — E7800 Pure hypercholesterolemia, unspecified: Secondary | ICD-10-CM | POA: Diagnosis not present

## 2017-12-07 DIAGNOSIS — I10 Essential (primary) hypertension: Secondary | ICD-10-CM | POA: Diagnosis not present

## 2017-12-22 DIAGNOSIS — I10 Essential (primary) hypertension: Secondary | ICD-10-CM | POA: Diagnosis not present

## 2018-04-21 DIAGNOSIS — H5211 Myopia, right eye: Secondary | ICD-10-CM | POA: Diagnosis not present

## 2018-04-21 DIAGNOSIS — H5202 Hypermetropia, left eye: Secondary | ICD-10-CM | POA: Diagnosis not present

## 2018-04-21 DIAGNOSIS — H25813 Combined forms of age-related cataract, bilateral: Secondary | ICD-10-CM | POA: Diagnosis not present

## 2018-05-31 DIAGNOSIS — Z0000 Encounter for general adult medical examination without abnormal findings: Secondary | ICD-10-CM | POA: Diagnosis not present

## 2018-05-31 DIAGNOSIS — E7800 Pure hypercholesterolemia, unspecified: Secondary | ICD-10-CM | POA: Diagnosis not present

## 2018-05-31 DIAGNOSIS — I10 Essential (primary) hypertension: Secondary | ICD-10-CM | POA: Diagnosis not present

## 2018-06-10 DIAGNOSIS — E7800 Pure hypercholesterolemia, unspecified: Secondary | ICD-10-CM | POA: Diagnosis not present

## 2018-06-10 DIAGNOSIS — E611 Iron deficiency: Secondary | ICD-10-CM | POA: Diagnosis not present

## 2018-06-10 DIAGNOSIS — I10 Essential (primary) hypertension: Secondary | ICD-10-CM | POA: Diagnosis not present

## 2018-06-10 DIAGNOSIS — Z0000 Encounter for general adult medical examination without abnormal findings: Secondary | ICD-10-CM | POA: Diagnosis not present

## 2018-08-24 ENCOUNTER — Other Ambulatory Visit: Payer: Self-pay | Admitting: Internal Medicine

## 2018-08-24 DIAGNOSIS — Z1231 Encounter for screening mammogram for malignant neoplasm of breast: Secondary | ICD-10-CM

## 2018-10-08 ENCOUNTER — Other Ambulatory Visit: Payer: Self-pay

## 2018-10-08 ENCOUNTER — Ambulatory Visit
Admission: RE | Admit: 2018-10-08 | Discharge: 2018-10-08 | Disposition: A | Discharge status: Home / Self Care | Payer: Medicare Other | Source: Ambulatory Visit | Attending: Internal Medicine | Admitting: Internal Medicine

## 2018-10-08 DIAGNOSIS — Z23 Encounter for immunization: Secondary | ICD-10-CM | POA: Diagnosis not present

## 2018-10-08 DIAGNOSIS — Z1231 Encounter for screening mammogram for malignant neoplasm of breast: Secondary | ICD-10-CM | POA: Diagnosis not present

## 2018-12-16 DIAGNOSIS — E7800 Pure hypercholesterolemia, unspecified: Secondary | ICD-10-CM | POA: Diagnosis not present

## 2018-12-16 DIAGNOSIS — I10 Essential (primary) hypertension: Secondary | ICD-10-CM | POA: Diagnosis not present

## 2018-12-16 DIAGNOSIS — E559 Vitamin D deficiency, unspecified: Secondary | ICD-10-CM | POA: Diagnosis not present

## 2018-12-23 DIAGNOSIS — E7800 Pure hypercholesterolemia, unspecified: Secondary | ICD-10-CM | POA: Diagnosis not present

## 2018-12-23 DIAGNOSIS — E559 Vitamin D deficiency, unspecified: Secondary | ICD-10-CM | POA: Diagnosis not present

## 2018-12-23 DIAGNOSIS — I10 Essential (primary) hypertension: Secondary | ICD-10-CM | POA: Diagnosis not present

## 2019-01-02 IMAGING — CR DG CHEST 2V
2 series · 2 of 2 positions shown · non-contrast
Comparison: None

CLINICAL DATA: RIGHT posterior chest wall pain, RIGHT upper
quadrant pain

EXAM:
CHEST  2 VIEW

[w chest pa]
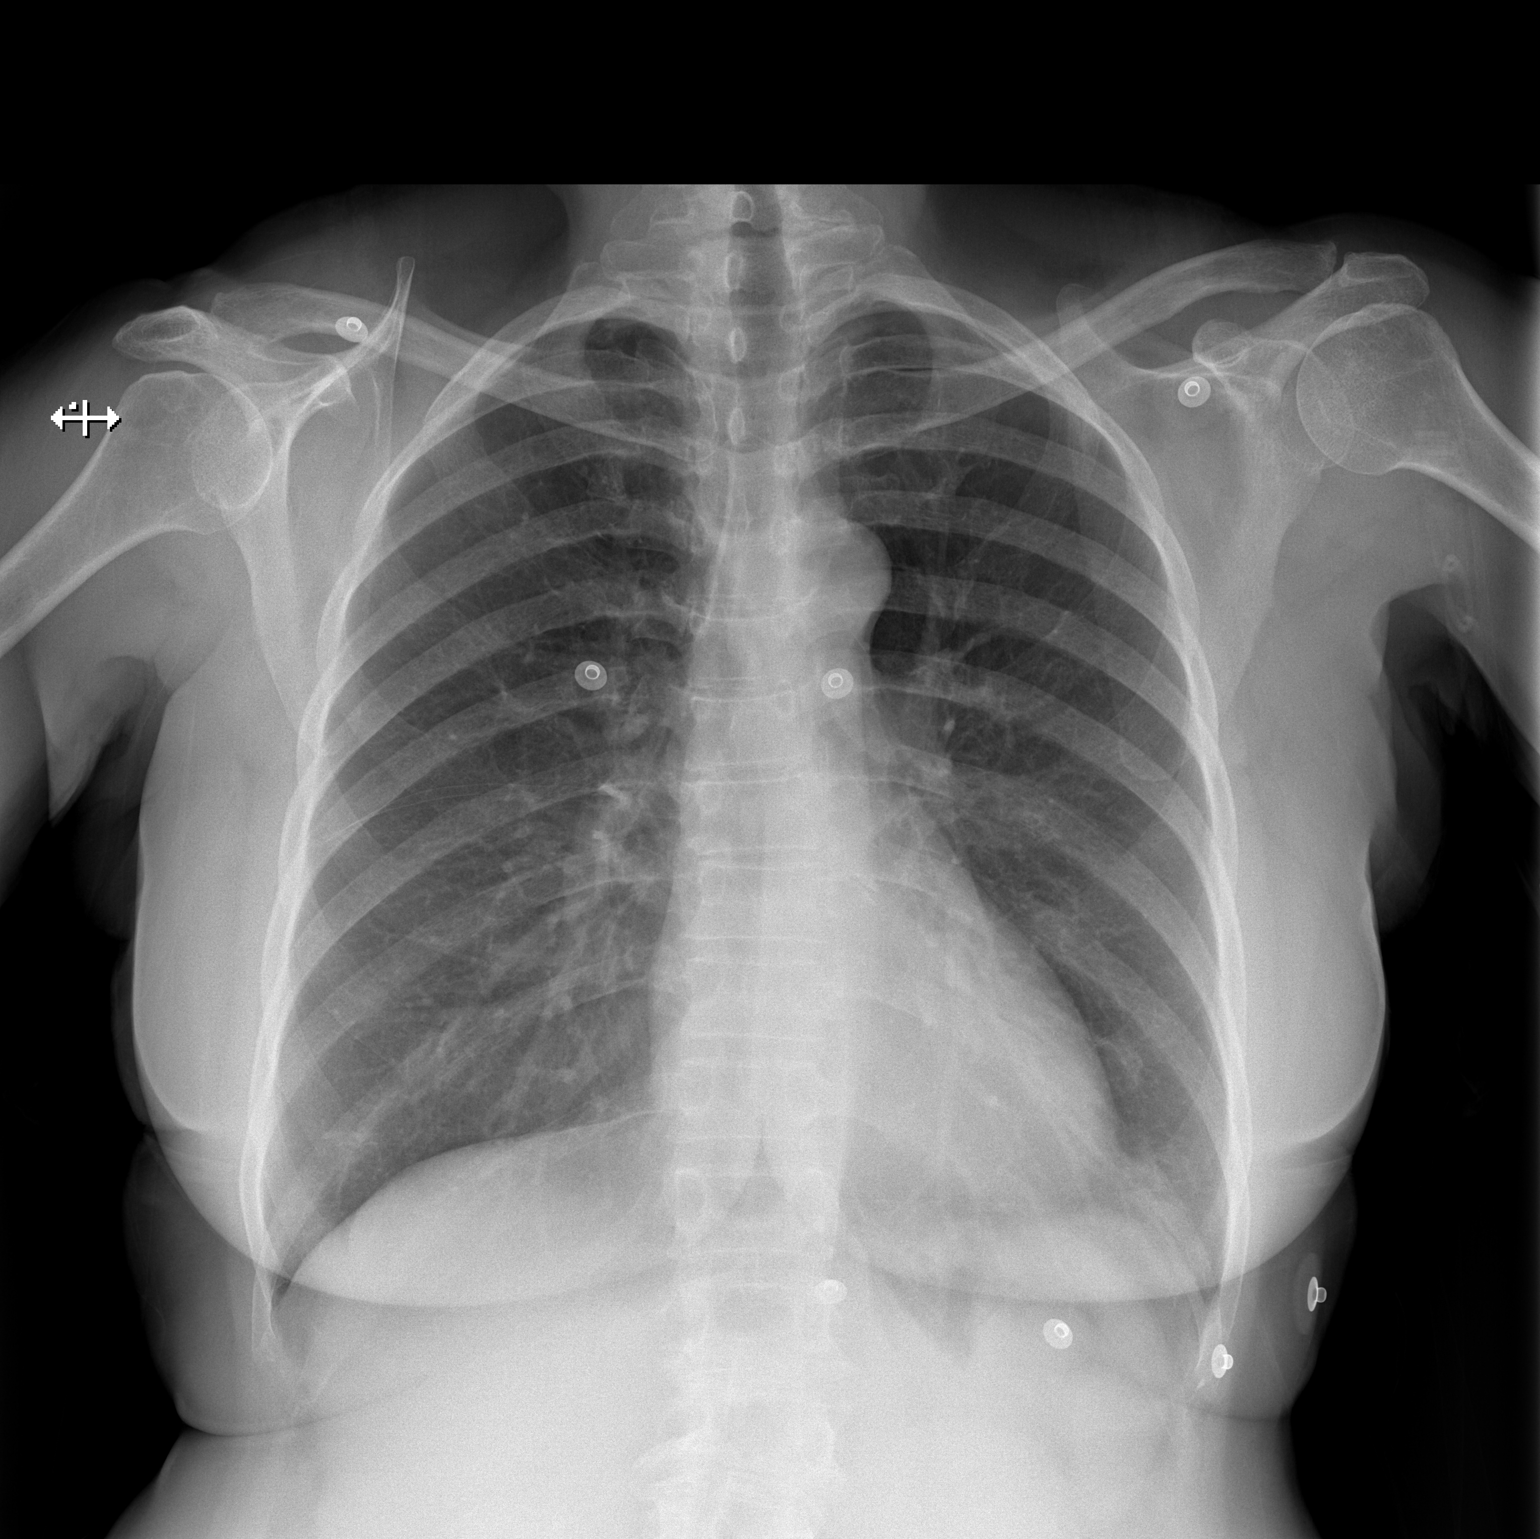

[w chest lat]
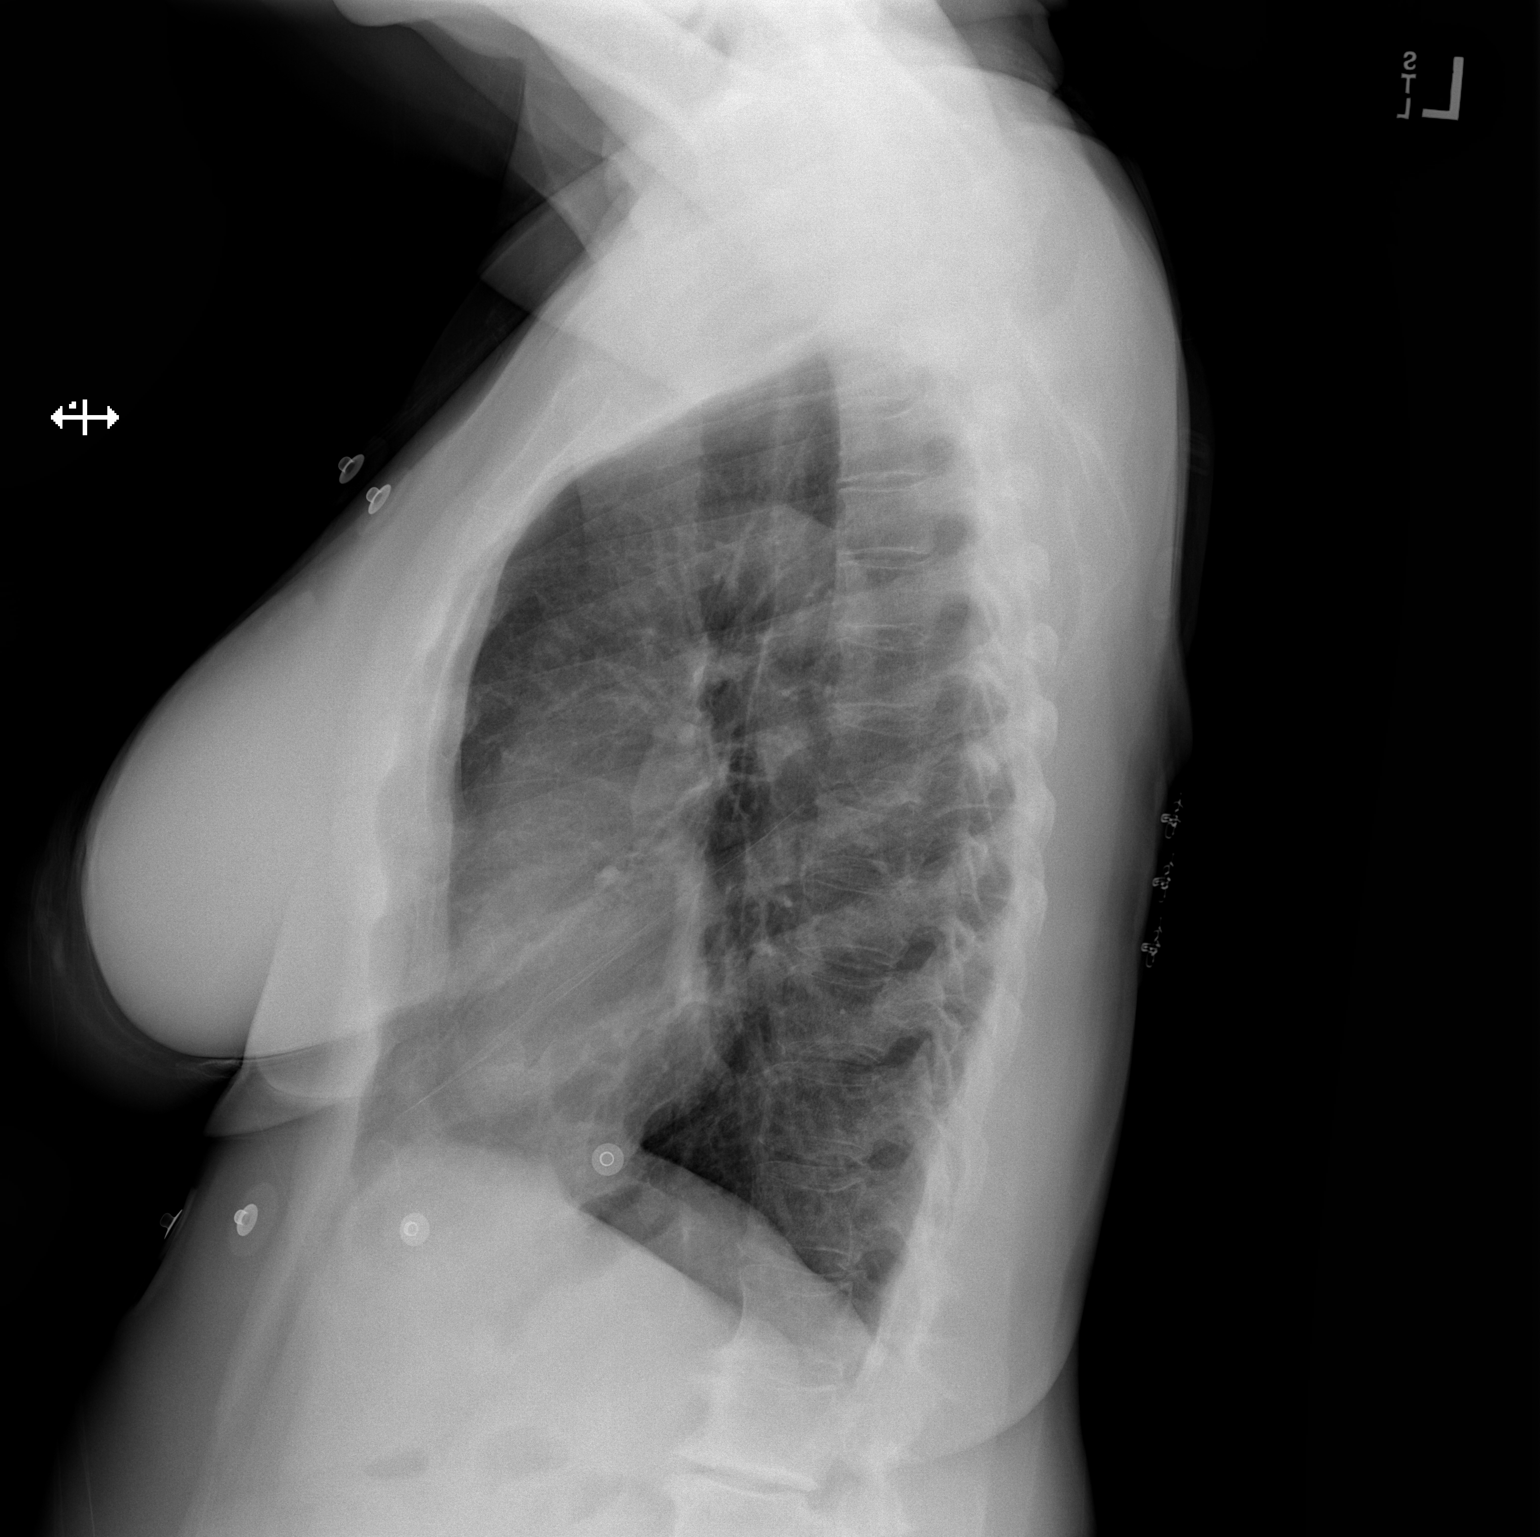

[2 of 2 positions shown; findings below may reference images not displayed]

FINDINGS: Normal heart size, mediastinal contours, and pulmonary vascularity.

Lungs clear.

No pleural effusion or pneumothorax.

Bones unremarkable.
IMPRESSION: Normal exam.

## 2019-01-20 DIAGNOSIS — I10 Essential (primary) hypertension: Secondary | ICD-10-CM | POA: Diagnosis not present

## 2019-01-20 DIAGNOSIS — K219 Gastro-esophageal reflux disease without esophagitis: Secondary | ICD-10-CM | POA: Diagnosis not present

## 2019-01-27 ENCOUNTER — Ambulatory Visit: Payer: Medicare Other | Attending: Internal Medicine

## 2019-01-27 DIAGNOSIS — R238 Other skin changes: Secondary | ICD-10-CM

## 2019-01-27 DIAGNOSIS — U071 COVID-19: Secondary | ICD-10-CM | POA: Diagnosis not present

## 2019-01-28 LAB — NOVEL CORONAVIRUS, NAA: SARS-CoV-2, NAA: NOT DETECTED

## 2019-02-09 ENCOUNTER — Ambulatory Visit: Payer: Medicare Other | Attending: Internal Medicine

## 2019-02-09 DIAGNOSIS — Z20822 Contact with and (suspected) exposure to covid-19: Secondary | ICD-10-CM | POA: Diagnosis not present

## 2019-02-11 LAB — NOVEL CORONAVIRUS, NAA: SARS-CoV-2, NAA: NOT DETECTED

## 2019-04-22 DIAGNOSIS — H2513 Age-related nuclear cataract, bilateral: Secondary | ICD-10-CM | POA: Diagnosis not present

## 2019-04-22 DIAGNOSIS — H5211 Myopia, right eye: Secondary | ICD-10-CM | POA: Diagnosis not present

## 2019-04-22 DIAGNOSIS — H5202 Hypermetropia, left eye: Secondary | ICD-10-CM | POA: Diagnosis not present

## 2019-04-25 DIAGNOSIS — H2513 Age-related nuclear cataract, bilateral: Secondary | ICD-10-CM | POA: Diagnosis not present

## 2019-04-25 DIAGNOSIS — H25013 Cortical age-related cataract, bilateral: Secondary | ICD-10-CM | POA: Diagnosis not present

## 2019-06-07 DIAGNOSIS — H25811 Combined forms of age-related cataract, right eye: Secondary | ICD-10-CM | POA: Diagnosis not present

## 2019-06-07 DIAGNOSIS — H25011 Cortical age-related cataract, right eye: Secondary | ICD-10-CM | POA: Diagnosis not present

## 2019-06-07 DIAGNOSIS — H2511 Age-related nuclear cataract, right eye: Secondary | ICD-10-CM | POA: Diagnosis not present

## 2019-06-21 DIAGNOSIS — E7800 Pure hypercholesterolemia, unspecified: Secondary | ICD-10-CM | POA: Diagnosis not present

## 2019-06-21 DIAGNOSIS — E559 Vitamin D deficiency, unspecified: Secondary | ICD-10-CM | POA: Diagnosis not present

## 2019-06-21 DIAGNOSIS — I10 Essential (primary) hypertension: Secondary | ICD-10-CM | POA: Diagnosis not present

## 2019-06-21 DIAGNOSIS — E876 Hypokalemia: Secondary | ICD-10-CM | POA: Diagnosis not present

## 2019-07-14 DIAGNOSIS — E7800 Pure hypercholesterolemia, unspecified: Secondary | ICD-10-CM | POA: Diagnosis not present

## 2019-07-14 DIAGNOSIS — Z0000 Encounter for general adult medical examination without abnormal findings: Secondary | ICD-10-CM | POA: Diagnosis not present

## 2019-07-14 DIAGNOSIS — K219 Gastro-esophageal reflux disease without esophagitis: Secondary | ICD-10-CM | POA: Diagnosis not present

## 2019-07-14 DIAGNOSIS — I10 Essential (primary) hypertension: Secondary | ICD-10-CM | POA: Diagnosis not present

## 2019-08-09 DIAGNOSIS — H2512 Age-related nuclear cataract, left eye: Secondary | ICD-10-CM | POA: Diagnosis not present

## 2019-08-09 DIAGNOSIS — H25812 Combined forms of age-related cataract, left eye: Secondary | ICD-10-CM | POA: Diagnosis not present

## 2019-08-09 DIAGNOSIS — H25012 Cortical age-related cataract, left eye: Secondary | ICD-10-CM | POA: Diagnosis not present

## 2019-09-12 DIAGNOSIS — Z961 Presence of intraocular lens: Secondary | ICD-10-CM | POA: Diagnosis not present

## 2020-01-10 DIAGNOSIS — E7800 Pure hypercholesterolemia, unspecified: Secondary | ICD-10-CM | POA: Diagnosis not present

## 2020-01-19 DIAGNOSIS — K219 Gastro-esophageal reflux disease without esophagitis: Secondary | ICD-10-CM | POA: Diagnosis not present

## 2020-01-19 DIAGNOSIS — I10 Essential (primary) hypertension: Secondary | ICD-10-CM | POA: Diagnosis not present

## 2020-01-19 DIAGNOSIS — E7800 Pure hypercholesterolemia, unspecified: Secondary | ICD-10-CM | POA: Diagnosis not present

## 2020-07-03 DIAGNOSIS — E7800 Pure hypercholesterolemia, unspecified: Secondary | ICD-10-CM | POA: Diagnosis not present

## 2020-07-03 DIAGNOSIS — K219 Gastro-esophageal reflux disease without esophagitis: Secondary | ICD-10-CM | POA: Diagnosis not present

## 2020-07-03 DIAGNOSIS — I10 Essential (primary) hypertension: Secondary | ICD-10-CM | POA: Diagnosis not present

## 2020-07-16 DIAGNOSIS — E559 Vitamin D deficiency, unspecified: Secondary | ICD-10-CM | POA: Diagnosis not present

## 2020-07-16 DIAGNOSIS — E7800 Pure hypercholesterolemia, unspecified: Secondary | ICD-10-CM | POA: Diagnosis not present

## 2020-07-19 DIAGNOSIS — K219 Gastro-esophageal reflux disease without esophagitis: Secondary | ICD-10-CM | POA: Diagnosis not present

## 2020-07-19 DIAGNOSIS — Z0000 Encounter for general adult medical examination without abnormal findings: Secondary | ICD-10-CM | POA: Diagnosis not present

## 2020-07-19 DIAGNOSIS — E559 Vitamin D deficiency, unspecified: Secondary | ICD-10-CM | POA: Diagnosis not present

## 2020-07-19 DIAGNOSIS — I10 Essential (primary) hypertension: Secondary | ICD-10-CM | POA: Diagnosis not present

## 2020-07-19 DIAGNOSIS — E7800 Pure hypercholesterolemia, unspecified: Secondary | ICD-10-CM | POA: Diagnosis not present

## 2020-09-02 DIAGNOSIS — K219 Gastro-esophageal reflux disease without esophagitis: Secondary | ICD-10-CM | POA: Diagnosis not present

## 2020-09-02 DIAGNOSIS — I10 Essential (primary) hypertension: Secondary | ICD-10-CM | POA: Diagnosis not present

## 2020-09-02 DIAGNOSIS — E7800 Pure hypercholesterolemia, unspecified: Secondary | ICD-10-CM | POA: Diagnosis not present

## 2020-09-10 DIAGNOSIS — H52201 Unspecified astigmatism, right eye: Secondary | ICD-10-CM | POA: Diagnosis not present

## 2020-09-10 DIAGNOSIS — H5201 Hypermetropia, right eye: Secondary | ICD-10-CM | POA: Diagnosis not present

## 2020-11-02 DIAGNOSIS — I10 Essential (primary) hypertension: Secondary | ICD-10-CM | POA: Diagnosis not present

## 2020-11-02 DIAGNOSIS — E039 Hypothyroidism, unspecified: Secondary | ICD-10-CM | POA: Diagnosis not present

## 2020-11-02 DIAGNOSIS — E782 Mixed hyperlipidemia: Secondary | ICD-10-CM | POA: Diagnosis not present

## 2020-11-02 DIAGNOSIS — M810 Age-related osteoporosis without current pathological fracture: Secondary | ICD-10-CM | POA: Diagnosis not present

## 2021-01-11 DIAGNOSIS — E7800 Pure hypercholesterolemia, unspecified: Secondary | ICD-10-CM | POA: Diagnosis not present

## 2021-01-18 DIAGNOSIS — E7800 Pure hypercholesterolemia, unspecified: Secondary | ICD-10-CM | POA: Diagnosis not present

## 2021-01-18 DIAGNOSIS — I10 Essential (primary) hypertension: Secondary | ICD-10-CM | POA: Diagnosis not present

## 2021-01-18 DIAGNOSIS — E559 Vitamin D deficiency, unspecified: Secondary | ICD-10-CM | POA: Diagnosis not present

## 2021-04-02 DIAGNOSIS — E7800 Pure hypercholesterolemia, unspecified: Secondary | ICD-10-CM | POA: Diagnosis not present

## 2021-04-02 DIAGNOSIS — K219 Gastro-esophageal reflux disease without esophagitis: Secondary | ICD-10-CM | POA: Diagnosis not present

## 2021-04-02 DIAGNOSIS — I10 Essential (primary) hypertension: Secondary | ICD-10-CM | POA: Diagnosis not present

## 2021-07-15 DIAGNOSIS — E559 Vitamin D deficiency, unspecified: Secondary | ICD-10-CM | POA: Diagnosis not present

## 2021-07-15 DIAGNOSIS — Z0000 Encounter for general adult medical examination without abnormal findings: Secondary | ICD-10-CM | POA: Diagnosis not present

## 2021-07-15 DIAGNOSIS — E7800 Pure hypercholesterolemia, unspecified: Secondary | ICD-10-CM | POA: Diagnosis not present

## 2021-07-15 DIAGNOSIS — I10 Essential (primary) hypertension: Secondary | ICD-10-CM | POA: Diagnosis not present

## 2021-07-15 DIAGNOSIS — D509 Iron deficiency anemia, unspecified: Secondary | ICD-10-CM | POA: Diagnosis not present

## 2021-07-22 DIAGNOSIS — Z0000 Encounter for general adult medical examination without abnormal findings: Secondary | ICD-10-CM | POA: Diagnosis not present

## 2021-07-22 DIAGNOSIS — E7800 Pure hypercholesterolemia, unspecified: Secondary | ICD-10-CM | POA: Diagnosis not present

## 2021-07-22 DIAGNOSIS — E559 Vitamin D deficiency, unspecified: Secondary | ICD-10-CM | POA: Diagnosis not present

## 2021-07-22 DIAGNOSIS — I10 Essential (primary) hypertension: Secondary | ICD-10-CM | POA: Diagnosis not present

## 2021-09-12 DIAGNOSIS — H35371 Puckering of macula, right eye: Secondary | ICD-10-CM | POA: Diagnosis not present

## 2021-09-12 DIAGNOSIS — H26491 Other secondary cataract, right eye: Secondary | ICD-10-CM | POA: Diagnosis not present

## 2021-09-12 DIAGNOSIS — H52203 Unspecified astigmatism, bilateral: Secondary | ICD-10-CM | POA: Diagnosis not present

## 2022-01-14 DIAGNOSIS — E7800 Pure hypercholesterolemia, unspecified: Secondary | ICD-10-CM | POA: Diagnosis not present

## 2022-01-21 DIAGNOSIS — R7301 Impaired fasting glucose: Secondary | ICD-10-CM | POA: Diagnosis not present

## 2022-01-21 DIAGNOSIS — E559 Vitamin D deficiency, unspecified: Secondary | ICD-10-CM | POA: Diagnosis not present

## 2022-01-21 DIAGNOSIS — E7800 Pure hypercholesterolemia, unspecified: Secondary | ICD-10-CM | POA: Diagnosis not present

## 2022-01-21 DIAGNOSIS — I10 Essential (primary) hypertension: Secondary | ICD-10-CM | POA: Diagnosis not present

## 2022-07-22 DIAGNOSIS — I10 Essential (primary) hypertension: Secondary | ICD-10-CM | POA: Diagnosis not present

## 2022-07-22 DIAGNOSIS — E7800 Pure hypercholesterolemia, unspecified: Secondary | ICD-10-CM | POA: Diagnosis not present

## 2022-07-22 DIAGNOSIS — E559 Vitamin D deficiency, unspecified: Secondary | ICD-10-CM | POA: Diagnosis not present

## 2022-07-29 DIAGNOSIS — E7800 Pure hypercholesterolemia, unspecified: Secondary | ICD-10-CM | POA: Diagnosis not present

## 2022-07-29 DIAGNOSIS — Z0000 Encounter for general adult medical examination without abnormal findings: Secondary | ICD-10-CM | POA: Diagnosis not present

## 2022-07-29 DIAGNOSIS — E559 Vitamin D deficiency, unspecified: Secondary | ICD-10-CM | POA: Diagnosis not present

## 2022-07-29 DIAGNOSIS — I10 Essential (primary) hypertension: Secondary | ICD-10-CM | POA: Diagnosis not present

## 2022-09-19 DIAGNOSIS — H35371 Puckering of macula, right eye: Secondary | ICD-10-CM | POA: Diagnosis not present

## 2022-09-19 DIAGNOSIS — H5203 Hypermetropia, bilateral: Secondary | ICD-10-CM | POA: Diagnosis not present

## 2023-01-30 DIAGNOSIS — E7800 Pure hypercholesterolemia, unspecified: Secondary | ICD-10-CM | POA: Diagnosis not present

## 2023-02-06 DIAGNOSIS — E559 Vitamin D deficiency, unspecified: Secondary | ICD-10-CM | POA: Diagnosis not present

## 2023-02-06 DIAGNOSIS — E7800 Pure hypercholesterolemia, unspecified: Secondary | ICD-10-CM | POA: Diagnosis not present

## 2023-02-06 DIAGNOSIS — I10 Essential (primary) hypertension: Secondary | ICD-10-CM | POA: Diagnosis not present

## 2023-07-23 DIAGNOSIS — E559 Vitamin D deficiency, unspecified: Secondary | ICD-10-CM | POA: Diagnosis not present

## 2023-07-23 DIAGNOSIS — Z0000 Encounter for general adult medical examination without abnormal findings: Secondary | ICD-10-CM | POA: Diagnosis not present

## 2023-07-23 DIAGNOSIS — E7800 Pure hypercholesterolemia, unspecified: Secondary | ICD-10-CM | POA: Diagnosis not present

## 2023-07-23 DIAGNOSIS — I10 Essential (primary) hypertension: Secondary | ICD-10-CM | POA: Diagnosis not present

## 2023-07-30 DIAGNOSIS — I10 Essential (primary) hypertension: Secondary | ICD-10-CM | POA: Diagnosis not present

## 2023-07-30 DIAGNOSIS — R7301 Impaired fasting glucose: Secondary | ICD-10-CM | POA: Diagnosis not present

## 2023-07-30 DIAGNOSIS — E7800 Pure hypercholesterolemia, unspecified: Secondary | ICD-10-CM | POA: Diagnosis not present

## 2023-07-30 DIAGNOSIS — E559 Vitamin D deficiency, unspecified: Secondary | ICD-10-CM | POA: Diagnosis not present

## 2023-07-30 DIAGNOSIS — Z0000 Encounter for general adult medical examination without abnormal findings: Secondary | ICD-10-CM | POA: Diagnosis not present

## 2023-09-21 DIAGNOSIS — H52203 Unspecified astigmatism, bilateral: Secondary | ICD-10-CM | POA: Diagnosis not present

## 2023-09-21 DIAGNOSIS — H35371 Puckering of macula, right eye: Secondary | ICD-10-CM | POA: Diagnosis not present

## 2023-09-21 DIAGNOSIS — H26491 Other secondary cataract, right eye: Secondary | ICD-10-CM | POA: Diagnosis not present
